# Patient Record
Sex: Male | Born: 1985 | Race: Black or African American | Hispanic: No | Marital: Married | Smoking: Never smoker
Health system: Southern US, Community
[De-identification: ages and names within clinical notes are randomized; demographics above are authoritative.]

---
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
## 2005-03-15 ENCOUNTER — Emergency Department (HOSPITAL_COMMUNITY): Admission: EM | Admit: 2005-03-15 | Discharge: 2005-03-15 | Payer: Self-pay | Admitting: Family Medicine

## 2014-10-06 DIAGNOSIS — Z79899 Other long term (current) drug therapy: Secondary | ICD-10-CM | POA: Insufficient documentation

## 2014-10-06 DIAGNOSIS — L700 Acne vulgaris: Secondary | ICD-10-CM | POA: Insufficient documentation

## 2016-10-14 DIAGNOSIS — L739 Follicular disorder, unspecified: Secondary | ICD-10-CM | POA: Insufficient documentation

## 2019-04-06 DIAGNOSIS — M7989 Other specified soft tissue disorders: Secondary | ICD-10-CM | POA: Diagnosis not present

## 2019-04-06 DIAGNOSIS — M79674 Pain in right toe(s): Secondary | ICD-10-CM | POA: Diagnosis not present

## 2019-04-06 DIAGNOSIS — Z0000 Encounter for general adult medical examination without abnormal findings: Secondary | ICD-10-CM | POA: Diagnosis not present

## 2019-04-06 DIAGNOSIS — N289 Disorder of kidney and ureter, unspecified: Secondary | ICD-10-CM | POA: Diagnosis not present

## 2019-04-06 DIAGNOSIS — Z130 Encounter for screening for diseases of the blood and blood-forming organs and certain disorders involving the immune mechanism: Secondary | ICD-10-CM | POA: Diagnosis not present

## 2019-04-06 DIAGNOSIS — I10 Essential (primary) hypertension: Secondary | ICD-10-CM | POA: Insufficient documentation

## 2019-12-28 DIAGNOSIS — I10 Essential (primary) hypertension: Secondary | ICD-10-CM | POA: Diagnosis not present

## 2020-02-03 ENCOUNTER — Ambulatory Visit: Payer: Self-pay | Admitting: Cardiovascular Disease

## 2020-02-15 DIAGNOSIS — I10 Essential (primary) hypertension: Secondary | ICD-10-CM | POA: Diagnosis not present

## 2020-02-16 DIAGNOSIS — Z23 Encounter for immunization: Secondary | ICD-10-CM | POA: Diagnosis not present

## 2020-03-17 ENCOUNTER — Other Ambulatory Visit: Payer: Self-pay

## 2020-03-17 ENCOUNTER — Ambulatory Visit (INDEPENDENT_AMBULATORY_CARE_PROVIDER_SITE_OTHER): Payer: BC Managed Care – PPO | Admitting: Cardiovascular Disease

## 2020-03-17 ENCOUNTER — Encounter: Payer: Self-pay | Admitting: Cardiovascular Disease

## 2020-03-17 DIAGNOSIS — I10 Essential (primary) hypertension: Secondary | ICD-10-CM | POA: Diagnosis not present

## 2020-03-17 DIAGNOSIS — E7800 Pure hypercholesterolemia, unspecified: Secondary | ICD-10-CM

## 2020-03-17 HISTORY — DX: Pure hypercholesterolemia, unspecified: E78.00

## 2020-03-17 HISTORY — DX: Essential (primary) hypertension: I10

## 2020-03-17 MED ORDER — CHLORTHALIDONE 25 MG PO TABS: 25.0000 mg | ORAL_TABLET | Freq: Every day | ORAL | 3 refills | Status: DC

## 2020-03-17 NOTE — Progress Notes (Signed)
Hypertension Clinic Initial Assessment:    Date:  03/17/2020   ID:  Victor Black, DOB [birth year], MRN [id number]  PCP:  Tomasa Hose, NP  Cardiologist:  No primary care provider on file.  Nephrologist:  Referring MD: No ref. provider found   CC: Hypertension  History of Present Illness:    Victor Black is a 35 y.o. male with a hx of hypertension and hyperlipidemia here to establish care in the hypertension clinic.  He was first diagnosed with hypertension 6 years ago.  Since then he is struggled with intolerances to different medications.  He was initially started on lisinopril but developed pruritus.  This was discontinued and he started on amlodipine.  This controlled his blood pressure well, but he developed lower extremity edema.  He was then switched to diltiazem which did not control his blood pressure.  Most recently he was started on losartan.  He feels poorly on this medication and does not feel like himself.  Overall he is very active.  He cycles 4 days a week and has no exertional chest pain or shortness of breath.  He denies any lower extremity edema, orthopnea, or PND.  He does not snore.  He has limited caffeine and salt intake.  He rarely drinks alcohol.  His meals are prepared at home and are not salty.  He has not eaten any beef or pork in years.  He tried stopping his medication and he felt much better, but his blood pressure was in the 150s.  On the medicine most recently it has been in the 140s.  He is very concerned about his health because his father died of a stroke at age 55.  He has several family members on his father's side who also struggle with poorly controlled hypertension and strokes.  His cholesterol has been high but not enough to require medications.  Previous antihypertensives: Lisinopril- itching Amlodipine-edema Diltiazem-didn't work Losartan-chest pain, dizzy   Past Medical History:  Diagnosis Date  . Essential hypertension 03/17/2020  . Pure  hypercholesterolemia 03/17/2020    History reviewed. No pertinent surgical history.  Current Medications: Current Meds  Medication Sig  . chlorthalidone (HYGROTON) 25 MG tablet Take 1 tablet (25 mg total) by mouth daily.  . [DISCONTINUED] lisinopril (ZESTRIL) 20 MG tablet TAKE 1 TABLET(20 MG) BY MOUTH DAILY  . [DISCONTINUED] losartan (COZAAR) 25 MG tablet Take 28m (1 TABLET) for 5 days and then increase to 565m(2 TABLETS) if blood pressure remains greater than 130/80     Allergies:   Lisinopril   Social History   Socioeconomic History  . Marital status: Single    Spouse name: Not on file  . Number of children: Not on file  . Years of education: Not on file  . Highest education level: Not on file  Occupational History  . Not on file  Tobacco Use  . Smoking status: Never Smoker  . Smokeless tobacco: Never Used  Substance and Sexual Activity  . Alcohol use: Not on file  . Drug use: Not on file  . Sexual activity: Not on file  Other Topics Concern  . Not on file  Social History Narrative  . Not on file   Social Determinants of Health   Financial Resource Strain: Not on file  Food Insecurity: Not on file  Transportation Needs: Not on file  Physical Activity: Not on file  Stress: Not on file  Social Connections: Not on file     Family History: The patient's family  history includes Hypertension in his father and maternal uncle; Stroke in his maternal uncle; Stroke (age of onset: 70) in his father; Thyroid disease in his mother.  ROS:   Please see the history of present illness.    All other systems reviewed and are negative.  EKGs/Labs/Other Studies Reviewed:    EKG:  EKG is ordered today.  The ekg ordered today demonstrates sinus rhythm.  Rate 62 bpm.  Recent Labs: No results found for requested labs within last 8760 hours.   Recent Lipid Panel No results found for: CHOL, TRIG, HDL, CHOLHDL, VLDL, LDLCALC, LDLDIRECT   04/06/2019: Sodium 144, potassium 4.1,  BUN 16, creatinine 1.3, AST 14, ALT 11 EGFR 72 Total cholesterol 206, triglyceride 222, HDL 41, LDL 126  10/2018: Total cholesterol 204, triglycerides 165, HDL 39, LDL 135   Physical Exam:    VS:  BP 132/82   Pulse 62   Ht 6' (1.829 m)   Wt 213 lb (96.6 kg)   SpO2 97%   BMI 28.89 kg/m  , BMI Body mass index is 28.89 kg/m. GENERAL:  Well appearing HEENT: Pupils equal round and reactive, fundi not visualized, oral mucosa unremarkable NECK:  No jugular venous distention, waveform within normal limits, carotid upstroke brisk and symmetric, no bruits LUNGS:  Clear to auscultation bilaterally HEART:  RRR.  PMI not displaced or sustained,S1 and S2 within normal limits, no S3, no S4, no clicks, no rubs, no murmurs ABD:  Flat, positive bowel sounds normal in frequency in pitch, no bruits, no rebound, no guarding, no midline pulsatile mass, no hepatomegaly, no splenomegaly EXT:  2 plus pulses throughout, no edema, no cyanosis no clubbing SKIN:  No rashes no nodules NEURO:  Cranial nerves II through XII grossly intact, motor grossly intact throughout PSYCH:  Cognitively intact, oriented to person place and time   ASSESSMENT:    1. Essential hypertension   2. Pure hypercholesterolemia     PLAN:    # Hypertension:  Mr. Gundry's blood pressure has been high since his 44s.  This is despite having a very healthy lifestyle where he exercises regularly and has a healthy diet limited in sodium intake.  Therefore, he warrants a work-up for secondary causes of hypertension.  We will check renal artery Dopplers.  We will also check renal and and aldosterone levels and a TSH.  He is not tolerating losartan.  We will stop this medication.  Switch to chlorthalidone 25 mg daily.  Check a CMP in 1 week.  Secondary Causes of Hypertension  Medications/Herbal: OCP, steroids, stimulants, antidepressants, weight loss medication, immune suppressants, NSAIDs, sympathomimetics, alcohol, caffeine, licorice,  ginseng, St. John's wort, chemo (none identified)  Sleep Apnea: Testing not indicated Renal artery stenosis: Check renal artery Dopplers Hyperaldosteronism: Check renin and aldosterone Hyper/hypothyroidism: Check TSH Pheochromocytoma: Testing indicated Cushing's syndrome: Testing not indicated Coarctation of the aorta: Blood pressure symmetric  # Hyperlipidemia:  Lipids are elevated despite healthy lifestyle as above.  Given his family history of premature stroke, I am concerned that his lipid goal may not be accurately captured by the ASCVD 10-year risk score.  We will get a coronary calcium score.  Assess the need for further intervention based on those findings.  For now, continue diet and exercise.   Disposition:    FU with MD/PharmD in 1 month    Medication Adjustments/Labs and Tests Ordered: Current medicines are reviewed at length with the patient today.  Concerns regarding medicines are outlined above.  Orders Placed This Encounter  Procedures  . CT CARDIAC SCORING (SELF PAY ONLY)  . TSH  . Basic metabolic panel  . Aldosterone + renin activity w/ ratio  . EKG 12-Lead  . VAS US RENAL ARTERY DUPLEX   Meds ordered this encounter  Medications  . chlorthalidone (HYGROTON) 25 MG tablet    Sig: Take 1 tablet (25 mg total) by mouth daily.    Dispense:  90 tablet    Refill:  3     Signed, Skeet Latch, MD  03/17/2020 5:38 PM    [place]

## 2020-03-17 NOTE — Patient Instructions (Addendum)
Medication Instructions:  STOP LOSARTAN   START CHLORTHALIDONE 25 MG DAILY    Labwork: BMET/RENIN/ALDOSTERONE/TSH 1 WEEK, FIRST THING IN THE MORNING    Testing/Procedures: Your physician has requested that you have a renal artery duplex. During this test, an ultrasound is used to evaluate blood flow to the kidneys. Allow one hour for this exam. Do not eat after midnight the day before and avoid carbonated beverages. Take your medications as you usually do.  CALCIUM SCORE THIS WILL BE $99 OUT OF POCKET [facility] AT [address]   Follow-Up: 1 MONTH PHARM D 04/15/2020 AT 9:30 AM    Special Instructions:   MONITOR YOUR BLOOD PRESSURE TWICE A DAY, LOG IN THE BOOK PROVIDED. BRING THE BOOK AND YOUR BLOOD PRESSURE MACHINE TO YOUR FOLLOW UP IN 1 MONTH

## 2020-03-25 DIAGNOSIS — Z3009 Encounter for other general counseling and advice on contraception: Secondary | ICD-10-CM | POA: Diagnosis not present

## 2020-04-04 DIAGNOSIS — Z302 Encounter for sterilization: Secondary | ICD-10-CM | POA: Diagnosis not present

## 2020-04-08 DIAGNOSIS — Z0000 Encounter for general adult medical examination without abnormal findings: Secondary | ICD-10-CM | POA: Diagnosis not present

## 2020-04-08 DIAGNOSIS — I10 Essential (primary) hypertension: Secondary | ICD-10-CM | POA: Diagnosis not present

## 2020-04-08 DIAGNOSIS — Z6828 Body mass index (BMI) 28.0-28.9, adult: Secondary | ICD-10-CM | POA: Diagnosis not present

## 2020-04-08 DIAGNOSIS — Z130 Encounter for screening for diseases of the blood and blood-forming organs and certain disorders involving the immune mechanism: Secondary | ICD-10-CM | POA: Diagnosis not present

## 2020-04-11 ENCOUNTER — Other Ambulatory Visit: Payer: Self-pay

## 2020-04-11 ENCOUNTER — Ambulatory Visit (INDEPENDENT_AMBULATORY_CARE_PROVIDER_SITE_OTHER)
Admission: RE | Admit: 2020-04-11 | Discharge: 2020-04-11 | Disposition: A | Discharge status: Home / Self Care | Payer: Self-pay | Source: Ambulatory Visit | Attending: Cardiovascular Disease | Admitting: Cardiovascular Disease

## 2020-04-11 DIAGNOSIS — I10 Essential (primary) hypertension: Secondary | ICD-10-CM

## 2020-04-11 DIAGNOSIS — E7800 Pure hypercholesterolemia, unspecified: Secondary | ICD-10-CM

## 2020-04-11 DIAGNOSIS — E782 Mixed hyperlipidemia: Secondary | ICD-10-CM | POA: Insufficient documentation

## 2020-04-14 ENCOUNTER — Telehealth: Payer: Self-pay | Admitting: *Deleted

## 2020-04-14 NOTE — Telephone Encounter (Signed)
Advised patient and sent to PCP  

## 2020-04-14 NOTE — Telephone Encounter (Signed)
-----   Message from Chilton Si, MD sent at 04/12/2020  3:49 PM EDT ----- There was no evidence of coronary artery disease on his CT scan.  Great news.  Based on that I think we should keep focusing on diet and exercise to control cholesterol rather than medication for now.

## 2020-04-15 ENCOUNTER — Other Ambulatory Visit: Payer: Self-pay

## 2020-04-15 ENCOUNTER — Encounter: Payer: Self-pay | Admitting: Pharmacist

## 2020-04-15 ENCOUNTER — Ambulatory Visit (INDEPENDENT_AMBULATORY_CARE_PROVIDER_SITE_OTHER): Payer: BC Managed Care – PPO | Admitting: Pharmacist

## 2020-04-15 ENCOUNTER — Ambulatory Visit (HOSPITAL_COMMUNITY)
Admission: RE | Admit: 2020-04-15 | Discharge: 2020-04-15 | Disposition: A | Discharge status: Home / Self Care | Payer: BC Managed Care – PPO | Source: Ambulatory Visit | Attending: Cardiology | Admitting: Cardiology

## 2020-04-15 VITALS — BP 128/82 | HR 68

## 2020-04-15 DIAGNOSIS — I10 Essential (primary) hypertension: Secondary | ICD-10-CM

## 2020-04-15 DIAGNOSIS — E7800 Pure hypercholesterolemia, unspecified: Secondary | ICD-10-CM

## 2020-04-15 NOTE — Patient Instructions (Addendum)
It was nice meeting you today.  We would like your blood pressure to be less than 130/80 and it was very close today  Continue your chlorthalidone 25 mg once a day in the morning  I would like to recheck your kidney function in about a week  Please continue to monitor your blood pressure at home and you can send in your readings  We will see you back in clinic in a month  Laural Golden, PharmD, BCACP, CDCES, CPP [facility] [address], [place], [place] [address] Phone: [phone]; Fax: [phone] 04/15/2020 9:29 AM

## 2020-04-15 NOTE — Progress Notes (Signed)
Patient ID: Victor Black                 DOB: [birth year]                      MRN: [id number]     HPI: Victor Black is a 35 y.o. male referred by Dr. Duke Salvia to HTN clinic. PMH is significant for HTN and HLD. He has a strong family history of early cardiac disease.  Seen by Dr. Duke Salvia on 03/17/20.    Patient presents today for first visit to Adv HTN clinic.  Patient has struggled with HTN in the past and his father died from a stroke at 75.  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  Brother is a Development worker, community in [place].    Overall he is extremely active and physically fit.  Bikes for 20-30 miles a day during the week and 60-70 miles per day on weekends.  Eats a low salt diet and has not had beef or pork for 7 years.  Likely had an allergic reaction to lisinopril but it was delayed.  Reports his blood pressure had been controlled while taking it until he started itching and it was d/c.  Took 10mg  of amlodipine in 2019 and tolerated it until Summer when he noticed his legs started swelling.  It appears he was started on 5mg  which was then titrated to 10mg .  Diltiazem and losartan made him feel weak.   Reports he has been tolerating chlorthalidone 25mg  with no adverse effects.  PCP ran lab work last week which showed slightly above normal Scr.  He thinks this may be his baseline since he is so physically active but unsure.  Takes his BP at home using a wrist cuff.  Reports his home readings are typically higher than at medical offices so is considering purchasing a new BP cuff.  Current HTN meds: chlorthalidone 25 mg daily Previously tried: lisinopril (itching), amlodipine (LEE), losartan (dizzy, chest pain), diltiazem (ineffective) BP goal: <130/80  Family History: Stroke - father at 39.  Father's family has history of HTN,   Wt Readings from Last 3 Encounters:  03/17/20 213 lb (96.6 kg)   BP Readings from Last 3 Encounters:  03/17/20 132/82   Pulse Readings from Last 3  Encounters:  03/17/20 62    Renal function: CrCl cannot be calculated (No successful lab value found.).  Past Medical History:  Diagnosis Date  . Essential hypertension 03/17/2020  . Pure hypercholesterolemia 03/17/2020    Current Outpatient Medications on File Prior to Visit  Medication Sig Dispense Refill  . chlorthalidone (HYGROTON) 25 MG tablet Take 1 tablet (25 mg total) by mouth daily. 90 tablet 3   No current facility-administered medications on file prior to visit.    Allergies  Allergen Reactions  . Lisinopril Itching     Assessment/Plan:    1. Hypertension - Patient BP in room today 128/82 which is slightly above goal of <130/80 but is improved from previous readings.  Recommended patient purchase upper arm cuff for more accurate home readings.  Patient purchased in room from 03/19/20.  Scr slightly above upper limit of normal from PCP lab work. Unclear if this is due to patient's exercise routine or adverse effect of chlorthalidone.  Recommend rechecking BMP in 2 weeks to monitor Scr to assess if renal function declining on chlorthalidone or if this is patient's baseline.  Patient is agreeable.  Also having renal study today.  Not a candidate for ACEi or ARB due to allergy and intolerance.  Possibly a candidate for trial with low dose CCB since patient noticed swelling while on high dose amlodipine. Since patient very physically active, do not want to use medications which may slow his heart rate.   Plan: Continue chlorthalidone 25mg  once daily Recheck BMP in 2 weeks Patient to self monitor BP at home using new BP cuff and send results via mychart If Scr stable, continue chlorthalidone.  If renal function declining, consider alternatives or reduced dose Recheck in clinic for HTN visit #2 in 4 weeks  , PharmD, BCACP, CDCES, CPP [facility] [address], [place], [place] Phone: [phone]; Fax: [phone] 04/15/2020  10:18 AM

## 2020-04-26 ENCOUNTER — Telehealth: Payer: Self-pay | Admitting: Cardiovascular Disease

## 2020-04-26 DIAGNOSIS — I701 Atherosclerosis of renal artery: Secondary | ICD-10-CM

## 2020-04-26 DIAGNOSIS — I10 Essential (primary) hypertension: Secondary | ICD-10-CM

## 2020-04-26 NOTE — Telephone Encounter (Signed)
Advised patient, order for CTA placed, and message to schedulers to arrange

## 2020-04-26 NOTE — Telephone Encounter (Signed)
Patient would like someone to go over his results from his test 04/15/20. He is leaving to travel internationally tomorrow (04/27/20) and would like someone to call him today

## 2020-04-26 NOTE — Telephone Encounter (Signed)
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  This may be artifact, but someone his age should not really have any blockage.  Recommend getting a CT-A of the abdomen to better assess.  This will give [place] a better answer as to whether or not this is truly a problem.

## 2020-04-27 ENCOUNTER — Telehealth: Payer: Self-pay | Admitting: Cardiovascular Disease

## 2020-04-27 NOTE — Telephone Encounter (Signed)
Spoke with patient regarding the Friday 05/13/20 2:30 pm CTA abdomen/pelvis appointment scheduled at [facility]---arrival time is 2:15 pm--[address], [address]---liquids only 4 hours prior to testing.  Will mail information to patient and he voiced his understanding.

## 2020-05-11 DIAGNOSIS — I10 Essential (primary) hypertension: Secondary | ICD-10-CM | POA: Diagnosis not present

## 2020-05-11 DIAGNOSIS — E7800 Pure hypercholesterolemia, unspecified: Secondary | ICD-10-CM | POA: Diagnosis not present

## 2020-05-12 LAB — BASIC METABOLIC PANEL
Glucose: 88 mg/dL (ref 65–99)
Potassium: 3.8 mmol/L (ref 3.5–5.2)

## 2020-05-12 LAB — TSH: TSH: 2.02 u[IU]/mL (ref 0.450–4.500)

## 2020-05-12 LAB — ALDOSTERONE + RENIN ACTIVITY W/ RATIO

## 2020-05-13 ENCOUNTER — Ambulatory Visit (INDEPENDENT_AMBULATORY_CARE_PROVIDER_SITE_OTHER)
Admission: RE | Admit: 2020-05-13 | Discharge: 2020-05-13 | Disposition: A | Discharge status: Home / Self Care | Payer: BC Managed Care – PPO | Source: Ambulatory Visit | Attending: Cardiovascular Disease | Admitting: Cardiovascular Disease

## 2020-05-13 ENCOUNTER — Other Ambulatory Visit: Payer: Self-pay

## 2020-05-13 ENCOUNTER — Ambulatory Visit (INDEPENDENT_AMBULATORY_CARE_PROVIDER_SITE_OTHER): Payer: BC Managed Care – PPO | Admitting: Pharmacist Clinician (PhC)/ Clinical Pharmacy Specialist

## 2020-05-13 ENCOUNTER — Inpatient Hospital Stay: Admission: RE | Admit: 2020-05-13 | Payer: BC Managed Care – PPO | Source: Ambulatory Visit

## 2020-05-13 VITALS — BP 122/88 | HR 61 | Resp 16 | Ht 72.0 in | Wt 210.6 lb

## 2020-05-13 DIAGNOSIS — I701 Atherosclerosis of renal artery: Secondary | ICD-10-CM | POA: Diagnosis not present

## 2020-05-13 DIAGNOSIS — I10 Essential (primary) hypertension: Secondary | ICD-10-CM | POA: Diagnosis not present

## 2020-05-13 MED ORDER — CHLORTHALIDONE 25 MG PO TABS: 12.5000 mg | ORAL_TABLET | Freq: Every day | ORAL | 3 refills | Status: DC

## 2020-05-13 MED ORDER — IOHEXOL 350 MG/ML SOLN
100.0000 mL | Freq: Once | INTRAVENOUS | Status: AC | PRN
  Administered 2020-05-13: 100 mL via INTRAVENOUS

## 2020-05-13 MED ORDER — AMLODIPINE BESYLATE 2.5 MG PO TABS: 2.5000 mg | ORAL_TABLET | Freq: Every day | ORAL | 3 refills | Status: DC

## 2020-05-13 NOTE — Progress Notes (Signed)
05/13/2020 Victor Black [birth year] [id number]   HPI:  Victor Black is a 35 y.o. male patient of Dr Duke Salvia, with a PMH below who presents today for advanced hypertension clinic follow up.  He was originally seen by Dr. Duke Salvia in February, who noted he had been first diagnosed with hypertension about 6 years ago.  In the interim he has struggled with ability to tolerate medications.  See intolerances listed below.  Dr. Duke Salvia started him on chlorthalidone 25 mg daily.  He was first seen in follow up last month at which time his pressure was much improved at 128/82.  He was tolerating the chlorthalidone well, although there was a slight bump in his SCr.  He was then seen by Laural Golden in the CVRR clinic.  At that time his pressure was much improved at 128/82.  No changes were made and he was scheduled for a repeat BMET check in another 2 weeks.  That lab showed further decline in SCr, from the baseline of 1.30 to 1.43.   He returns today for follow up.  His pressure is up a little today, but he has just come from his renal CT scan and is a bit nervous.  He is at the age his father passed away and he's trying to make sure he does everything right to stay healthy.  In his most recent lab Dr. Duke Salvia noted that we should probably cut back on chlorthalidone to protect kidney function.  He states he has done well with this med, no side effects or problems, and no issues with compliance.  We reviewed the results of his study this morning as well as previous secondary cause studies, to be sure he understood why each was done and what the results showed.      Blood Pressure Goal:  130/80  Current Medications: chlorthladione  Family Hx: father died from stroke at age 34; mother with hyperlipidemia  Social Hx: no tobacco,  rare alcohol and no caffeine  Diet: limited sodium, food prepared by chef/meal prep - little to no sodium; vegetables mostly fresh, no beef or pork, mostly chicken and [place],  has rare bag of potato chips, but mostly avoids snacking  Exercise: cycles 4 days per week 20-30 miles on weekdays, longer on weekends  Home BP readings:  Highest was 124 in past 3 weeks, last week all < 120, consistently 115-120, diastolic consistntly in the 80's.    Intolerances:   Lisinopril- itching  Amlodipine-edema  Diltiazem-didn't work  Losartan-chest pain, dizzy  Labs: 05/11/20:  Na 139, K 3.8, Glu 88, BUN 15, SCr 1.43  04/09/20:  Na 144, K 4.1, Glu 88, BUN 13, SCr 1.3   Wt Readings from Last 3 Encounters:  05/13/20 210 lb 9.6 oz (95.5 kg)  03/17/20 213 lb (96.6 kg)   BP Readings from Last 3 Encounters:  05/13/20 122/88  04/15/20 128/82  03/17/20 132/82   Pulse Readings from Last 3 Encounters:  05/13/20 61  04/15/20 68  03/17/20 62    Current Outpatient Medications  Medication Sig Dispense Refill  . amLODipine (NORVASC) 2.5 MG tablet Take 1 tablet (2.5 mg total) by mouth daily. 30 tablet 3  . chlorthalidone (HYGROTON) 25 MG tablet Take 0.5 tablets (12.5 mg total) by mouth daily. 45 tablet 3   No current facility-administered medications for this visit.    Allergies  Allergen Reactions  . Lisinopril Itching    Past Medical History:  Diagnosis Date  . Essential hypertension 03/17/2020  .  Pure hypercholesterolemia 03/17/2020    Blood pressure 122/88, pulse 61, resp. rate 16, height 6' (1.829 m), weight 210 lb 9.6 oz (95.5 kg), SpO2 97 %.  Essential hypertension Patient with hypertension, still mostly diastolic.  Most secondary causes have been eliminated, still waiting for results of hypoaldosterone panel.  Because of the bump in SCr (although not >30%) will cut chlorthalidone back to 12.5 mg daily.  Will also re-challenge with lower dose of amlodipine.  He will start 2.5 mg once daily, he can take both medications at the same time.  He will continue to watch home BP readings and return in one month for follow up.     Phillips Hay PharmD CPP [facility] [address] [place], [place] [address] [phone]

## 2020-05-13 NOTE — Assessment & Plan Note (Signed)
Patient with hypertension, still mostly diastolic.  Most secondary causes have been eliminated, still waiting for results of hypoaldosterone panel.  Because of the bump in SCr (although not >30%) will cut chlorthalidone back to 12.5 mg daily.  Will also re-challenge with lower dose of amlodipine.  He will start 2.5 mg once daily, he can take both medications at the same time.  He will continue to watch home BP readings and return in one month for follow up.

## 2020-05-13 NOTE — Patient Instructions (Addendum)
Return for a a follow up appointment May 17 at 2 pm  Go to the lab May 2 for kidney function tests  Take your BP meds as follows:   Cut chlorthalidone in half and take only 12.5 mg once daily   Start amlodipine 2.5 mg once daily.  You can take both of these at the same time  Bring all of your meds, your BP cuff and your record of home blood pressures to your next appointment.  Exercise as you're able, try to walk approximately 30 minutes per day.  Keep salt intake to a minimum, especially watch canned and prepared boxed foods.  Eat more fresh fruits and vegetables and fewer canned items.  Avoid eating in fast food restaurants.    HOW TO TAKE YOUR BLOOD PRESSURE: . Rest 5 minutes before taking your blood pressure. .  Don't smoke or drink caffeinated beverages for at least 30 minutes before. . Take your blood pressure before (not after) you eat. . Sit comfortably with your back supported and both feet on the floor (don't cross your legs). . Elevate your arm to heart level on a table or a desk. . Use the proper sized cuff. It should fit smoothly and snugly around your bare upper arm. There should be enough room to slip a fingertip under the cuff. The bottom edge of the cuff should be 1 inch above the crease of the elbow. . Ideally, take 3 measurements at one sitting and record the average.

## 2020-05-23 LAB — BASIC METABOLIC PANEL
BUN/Creatinine Ratio: 10 (ref 9–20)
BUN: 15 mg/dL (ref 6–20)
CO2: 25 mmol/L (ref 20–29)
Calcium: 10.2 mg/dL (ref 8.7–10.2)
Chloride: 96 mmol/L (ref 96–106)
Creatinine, Ser: 1.43 mg/dL — ABNORMAL HIGH (ref 0.76–1.27)
Sodium: 139 mmol/L (ref 134–144)
eGFR: 66 mL/min/{1.73_m2} (ref >59)

## 2020-05-23 LAB — ALDOSTERONE + RENIN ACTIVITY W/ RATIO: ALDOSTERONE: 11.6 ng/dL (ref 0.0–30.0)

## 2020-06-06 DIAGNOSIS — E782 Mixed hyperlipidemia: Secondary | ICD-10-CM | POA: Diagnosis not present

## 2020-06-14 ENCOUNTER — Other Ambulatory Visit: Payer: Self-pay

## 2020-06-14 ENCOUNTER — Ambulatory Visit (INDEPENDENT_AMBULATORY_CARE_PROVIDER_SITE_OTHER): Payer: BC Managed Care – PPO | Admitting: Pharmacist Clinician (PhC)/ Clinical Pharmacy Specialist

## 2020-06-14 ENCOUNTER — Encounter: Payer: Self-pay | Admitting: Pharmacist Clinician (PhC)/ Clinical Pharmacy Specialist

## 2020-06-14 DIAGNOSIS — I10 Essential (primary) hypertension: Secondary | ICD-10-CM | POA: Diagnosis not present

## 2020-06-14 MED ORDER — CARVEDILOL 3.125 MG PO TABS: 3.1250 mg | ORAL_TABLET | Freq: Two times a day (BID) | ORAL | 3 refills | Status: DC

## 2020-06-14 NOTE — Patient Instructions (Signed)
Return for a a follow up appointment June 20 at 10:30 am  Check your blood pressure at home daily and keep record of the readings.  Take your BP meds as follows:  Stop chlorthalidone  Start carvedilol 3.125 mg twice daily.  Call Belenda Cruise if you have any issues - [phone]  Bring all of your meds, your BP cuff and your record of home blood pressures to your next appointment.  Exercise as you're able, try to walk approximately 30 minutes per day.  Keep salt intake to a minimum, especially watch canned and prepared boxed foods.  Eat more fresh fruits and vegetables and fewer canned items.  Avoid eating in fast food restaurants.    HOW TO TAKE YOUR BLOOD PRESSURE: . Rest 5 minutes before taking your blood pressure. .  Don't smoke or drink caffeinated beverages for at least 30 minutes before. . Take your blood pressure before (not after) you eat. . Sit comfortably with your back supported and both feet on the floor (don't cross your legs). . Elevate your arm to heart level on a table or a desk. . Use the proper sized cuff. It should fit smoothly and snugly around your bare upper arm. There should be enough room to slip a fingertip under the cuff. The bottom edge of the cuff should be 1 inch above the crease of the elbow. . Ideally, take 3 measurements at one sitting and record the average.

## 2020-06-14 NOTE — Progress Notes (Signed)
06/14/2020 Victor Black 20-Oct-1985 [id number]   HPI:  Victor Black is a 35 y.o. male patient of Dr Duke Salvia, with a PMH below who presents today for advanced hypertension clinic follow up.  He was originally seen by Dr. Duke Salvia in February, who noted he had been first diagnosed with hypertension about 6 years ago.  In the interim he has struggled with ability to tolerate medications.  See intolerances listed below.  Dr. Duke Salvia started him on chlorthalidone 25 mg daily.  He was first seen in follow up last month at which time his pressure was much improved at 128/82.  He was tolerating the chlorthalidone well, although there was a slight bump in his SCr.  He was then seen by Laural Golden in the CVRR clinic.  At that time his pressure was much improved at 128/82.  No changes were made and he was scheduled for a repeat BMET check in another 2 weeks.  That lab showed further decline in SCr, from the baseline of 1.30 to 1.43.  Chlorthalidone was cut back to 12.5 mg daily and amlodipine 2.5 mg was added.  Unfortunately a follow up metabolic panel last week showed further increase in SCr to 1.56.    Today he returns for follow up.  He does note some edema in his ankles and feet, however it is not severe and he is willing to continue with this.  We will need to discontinue chlorthalidone at this time.      Blood Pressure Goal:  130/80  Current Medications: amlodipine 2.5 mg qd  Family Hx: father died from stroke at age 32; mother with hyperlipidemia  Social Hx: no tobacco,  rare alcohol and no caffeine  Diet: limited sodium, food prepared by chef/meal prep - little to no sodium; vegetables mostly fresh, no beef or pork, mostly chicken and [place], has rare bag of potato chips, but mostly avoids snacking  Exercise: cycles 4 days per week 20-30 miles on weekdays, longer on weekends  Home BP readings:  Has 10-12 readings on his phone from his [employer] cuff.  Systolic readings ranged 110-134 and  diastolic 77-87, with most of those readings in the 81-84 range.    Intolerances:   Lisinopril- itching  Amlodipine-edema  Diltiazem-didn't work  Losartan-chest pain, dizzy  Labs:  06/06/20:  Na 145, K 4, Glu 97, BUN 14, SCr 1.56  05/11/20:  Na 139, K 3.8, Glu 88, BUN 15, SCr 1.43  04/09/20:  Na 144, K 4.1, Glu 88, BUN 13, SCr 1.3   Wt Readings from Last 3 Encounters:  05/13/20 210 lb 9.6 oz (95.5 kg)  03/17/20 213 lb (96.6 kg)   BP Readings from Last 3 Encounters:  06/14/20 138/86  05/13/20 122/88  04/15/20 128/82   Pulse Readings from Last 3 Encounters:  06/14/20 64  05/13/20 61  04/15/20 68    Current Outpatient Medications  Medication Sig Dispense Refill  . carvedilol (COREG) 3.125 MG tablet Take 1 tablet (3.125 mg total) by mouth 2 (two) times daily. 60 tablet 3  . amLODipine (NORVASC) 2.5 MG tablet Take 1 tablet (2.5 mg total) by mouth daily. 30 tablet 3   No current facility-administered medications for this visit.    Allergies  Allergen Reactions  . Lisinopril Itching    Past Medical History:  Diagnosis Date  . Essential hypertension 03/17/2020  . Pure hypercholesterolemia 03/17/2020    Blood pressure 138/86, pulse 64.  Essential hypertension Patient with essential hypertension, still not to goal.  Unable to continue with ACE/ARB/diuretics due to worsening renal function. Discontinued chlorthalidone today and will have patient repeat labs in 2 weeks.  Encouraged him to stay well hydrated with exercise, now that weather is warming up.  Reviewed options of carvedilol or hydralazine for further BP lowering benefit.  Discussed side effects, dosing and mechanism of action.  Will have him start with carvedilol 3.125 mg twice daily.  He will continue with amlodipine 2.5 mg.  Explained that because of his sensitivities to medications, he may need 2-3 medications at low doses rather than 1 or 2 at more therapeutic doses.  Patient understands and is agreeable with this.     Phillips Hay PharmD CPP [facility] [address] [place], [place] [address] [phone]

## 2020-06-14 NOTE — Progress Notes (Deleted)
     HPI:  Victor Black is a 35 y.o. male patient of Dr Duke Salvia, with a PMH below who presents today for advanced hypertension clinic follow up.  He was originally seen by Dr. Duke Salvia in February, who noted he had been first diagnosed with hypertension about 6 years ago.  In the interim he has struggled with ability to tolerate medications.  See intolerances listed below.      Blood Pressure Goal:  < 130/80  Current Medications:  Amlodipine 2.5mg  daily chlorthladione 12.5mg  daily  Family Hx: father died from stroke at age 86; mother with hyperlipidemia  Social Hx: no tobacco,  rare alcohol and no caffeine  Diet: limited sodium, food prepared by chef/meal prep - little to no sodium; vegetables mostly fresh, no beef or pork, mostly chicken and [place], has rare bag of potato chips, but mostly avoids snacking  Exercise: cycles 4 days per week 20-30 miles on weekdays, longer on weekends  Home BP readings:  Highest was 124 in past 3 weeks, last week all < 120, consistently 115-120, diastolic consistntly in the 80's.    Intolerances:   Lisinopril- itching  Amlodipine-edema  Diltiazem-didn't work  Losartan-chest pain, dizzy  Labs: 05/11/20:  Na 139, K 3.8, Glu 88, BUN 15, SCr 1.43  04/09/20:  Na 144, K 4.1, Glu 88, BUN 13, SCr 1.3   Wt Readings from Last 3 Encounters:  05/13/20 210 lb 9.6 oz (95.5 kg)  03/17/20 213 lb (96.6 kg)   BP Readings from Last 3 Encounters:  05/13/20 122/88  04/15/20 128/82  03/17/20 132/82   Pulse Readings from Last 3 Encounters:  05/13/20 61  04/15/20 68  03/17/20 62    Current Outpatient Medications  Medication Sig Dispense Refill  . amLODipine (NORVASC) 2.5 MG tablet Take 1 tablet (2.5 mg total) by mouth daily. 30 tablet 3  . chlorthalidone (HYGROTON) 25 MG tablet Take 0.5 tablets (12.5 mg total) by mouth daily. 45 tablet 3   No current facility-administered medications for this visit.    Allergies  Allergen Reactions  . Lisinopril Itching     Past Medical History:  Diagnosis Date  . Essential hypertension 03/17/2020  . Pure hypercholesterolemia 03/17/2020    There were no vitals taken for this visit.  No problem-specific Assessment & Plan notes found for this encounter.   Zya Finkle Rodriguez-Guzman PharmD, BCPS, CPP [facility] [address] [place] [address] 06/14/2020 11:44 AM

## 2020-06-14 NOTE — Assessment & Plan Note (Signed)
Patient with essential hypertension, still not to goal.  Unable to continue with ACE/ARB/diuretics due to worsening renal function. Discontinued chlorthalidone today and will have patient repeat labs in 2 weeks.  Encouraged him to stay well hydrated with exercise, now that weather is warming up.  Reviewed options of carvedilol or hydralazine for further BP lowering benefit.  Discussed side effects, dosing and mechanism of action.  Will have him start with carvedilol 3.125 mg twice daily.  He will continue with amlodipine 2.5 mg.  Explained that because of his sensitivities to medications, he may need 2-3 medications at low doses rather than 1 or 2 at more therapeutic doses.  Patient understands and is agreeable with this.

## 2020-07-18 ENCOUNTER — Other Ambulatory Visit: Payer: Self-pay

## 2020-07-18 ENCOUNTER — Ambulatory Visit (INDEPENDENT_AMBULATORY_CARE_PROVIDER_SITE_OTHER): Payer: BC Managed Care – PPO | Admitting: Pharmacist Clinician (PhC)/ Clinical Pharmacy Specialist

## 2020-07-18 DIAGNOSIS — I10 Essential (primary) hypertension: Secondary | ICD-10-CM

## 2020-07-18 MED ORDER — CHLORTHALIDONE 25 MG PO TABS: ORAL_TABLET | ORAL | 3 refills | Status: DC

## 2020-07-18 MED ORDER — AMLODIPINE BESYLATE 2.5 MG PO TABS: 2.5000 mg | ORAL_TABLET | Freq: Every day | ORAL | 3 refills | Status: DC

## 2020-07-18 MED ORDER — CARVEDILOL 3.125 MG PO TABS: 3.1250 mg | ORAL_TABLET | Freq: Two times a day (BID) | ORAL | 3 refills | Status: DC

## 2020-07-18 NOTE — Progress Notes (Signed)
07/18/2020 Clay Solum February 15, 1985 [id number]   HPI:  Victor Black is a 35 y.o. male patient of Dr Duke Salvia, with a PMH below who presents today for advanced hypertension clinic follow up.  He was originally seen by Dr. Duke Salvia in February, who noted he had been first diagnosed with hypertension about 6 years ago.  In the interim he has struggled with ability to tolerate medications.  See intolerances listed below.  Dr. Duke Salvia started him on chlorthalidone 25 mg daily.  He was first seen in follow up last month at which time his pressure was much improved at 128/82.  He was tolerating the chlorthalidone well, although there was a slight bump in his SCr.  He was then seen by Laural Golden in the CVRR clinic.  At that time his pressure was much improved at 128/82.  No changes were made and he was scheduled for a repeat BMET check in another 2 weeks.  That lab showed further decline in SCr, from the baseline of 1.30 to 1.43.  Chlorthalidone was cut back to 12.5 mg daily and amlodipine 2.5 mg was added.  Unfortunately a follow up metabolic panel last week showed further increase in SCr to 1.56.   At his last visit we discontinued chlorthalidone due to worsening renal function  (20% increase) and instead added carvedilol 3.125 mg bid.    Today he returns for follow up.  He admits has not exercised much in the past month as he came down with COVID.  Had body aches for a few days, but no lasting effects.  Did not check home BP readings as much, but did note that they have been noticeably higher since stopping the chlorthalidone.  He has not repeated metabolic panel since stopping that, but was planning to do so next week.    Blood Pressure Goal:  130/80  Current Medications: amlodipine 2.5 mg qd, carvedilol 3.125  Family Hx: father died from stroke at age 28; mother with hyperlipidemia  Social Hx: no tobacco,  rare alcohol and no caffeine  Diet: limited sodium, food prepared by chef/meal prep -  little to no sodium; vegetables mostly fresh, no beef or pork, mostly chicken and [place], has rare bag of potato chips, but mostly avoids snacking  Exercise: cycles 4 days per week 20-30 miles on weekdays, longer on weekends  Home BP readings:  has a few readings listed on his phone.  Systolic running mostly 130's, but diastolic has jumped to mostly between 98-102 per patient.    Intolerances:   Lisinopril- itching  Amlodipine-edema  Diltiazem-didn't work  Losartan-chest pain, dizzy  Chlorthalidone - increase in SCr  Labs:  06/06/20:  Na 145, K 4, Glu 97, BUN 14, SCr 1.56  05/11/20:  Na 139, K 3.8, Glu 88, BUN 15, SCr 1.43  04/09/20:  Na 144, K 4.1, Glu 88, BUN 13, SCr 1.3   Wt Readings from Last 3 Encounters:  07/18/20 216 lb (98 kg)  05/13/20 210 lb 9.6 oz (95.5 kg)  03/17/20 213 lb (96.6 kg)   BP Readings from Last 3 Encounters:  07/18/20 (!) 132/104  06/14/20 138/86  05/13/20 122/88   Pulse Readings from Last 3 Encounters:  07/18/20 66  06/14/20 64  05/13/20 61    Current Outpatient Medications  Medication Sig Dispense Refill   chlorthalidone (HYGROTON) 25 MG tablet Take 1/2 tablet by mouth daily or as directed 45 tablet 3   rosuvastatin (CRESTOR) 10 MG tablet Take 10 mg by mouth once  a week.     amLODipine (NORVASC) 2.5 MG tablet Take 1 tablet (2.5 mg total) by mouth daily. 90 tablet 3   carvedilol (COREG) 3.125 MG tablet Take 1 tablet (3.125 mg total) by mouth 2 (two) times daily. 180 tablet 3   No current facility-administered medications for this visit.    Allergies  Allergen Reactions   Lisinopril Itching    Past Medical History:  Diagnosis Date   Essential hypertension 03/17/2020   Pure hypercholesterolemia 03/17/2020    Blood pressure (!) 132/104, pulse 66, resp. rate 16, height 6\' 1"  (1.854 m), weight 216 lb (98 kg), SpO2 99 %.  Essential hypertension Patient with primary diastolic hypertension, still not controled on low doses of amlodipine and  carvedilol.  Reviewed options for medication that would be efficient at lowering the diastolic.  Chlorthalidone did bring this down, as would an ARB.  Reviewed options with patient, he will re-start the chlorthalidone, but will try 1/2 tablet (12.5 mg) every other day.  Will repeat metabolic panel in 10 days.  Patient agreeable to plan.  Will have him follow up with Dr. in 2 months, (he is aware this will be at the [facility] location).    [facility] PharmD CPP [facility] [address] [place], [place] [phone]

## 2020-07-18 NOTE — Assessment & Plan Note (Signed)
Patient with primary diastolic hypertension, still not controled on low doses of amlodipine and carvedilol.  Reviewed options for medication that would be efficient at lowering the diastolic.  Chlorthalidone did bring this down, as would an ARB.  Reviewed options with patient, he will re-start the chlorthalidone, but will try 1/2 tablet (12.5 mg) every other day.  Will repeat metabolic panel in 10 days.  Patient agreeable to plan.  Will have him follow up with Dr. Duke Salvia in 2 months, (he is aware this will be at the [facility] location).

## 2020-07-18 NOTE — Patient Instructions (Addendum)
We will contact you for a follow up appointment with Dr. Duke Salvia for August  Go to the lab late next week to check kidney function  Check your blood pressure at home daily and keep record of the readings.  Take your BP meds as follows:  Start chlorthalidone 12.5 mg (1/2 tablet) every other day.  Continue with all other medications  Bring all of your meds, your BP cuff and your record of home blood pressures to your next appointment.  Exercise as you're able, try to walk approximately 30 minutes per day.  Keep salt intake to a minimum, especially watch canned and prepared boxed foods.  Eat more fresh fruits and vegetables and fewer canned items.  Avoid eating in fast food restaurants.    HOW TO TAKE YOUR BLOOD PRESSURE: Rest 5 minutes before taking your blood pressure.  Don't smoke or drink caffeinated beverages for at least 30 minutes before. Take your blood pressure before (not after) you eat. Sit comfortably with your back supported and both feet on the floor (don't cross your legs). Elevate your arm to heart level on a table or a desk. Use the proper sized cuff. It should fit smoothly and snugly around your bare upper arm. There should be enough room to slip a fingertip under the cuff. The bottom edge of the cuff should be 1 inch above the crease of the elbow. Ideally, take 3 measurements at one sitting and record the average.

## 2020-08-12 ENCOUNTER — Telehealth (HOSPITAL_BASED_OUTPATIENT_CLINIC_OR_DEPARTMENT_OTHER): Payer: Self-pay | Admitting: *Deleted

## 2020-08-12 NOTE — Telephone Encounter (Signed)
-----   Message from Rosalee Kaufman, RPH-CPP sent at 08/05/2020 10:08 AM EDT ----- Juliette Alcide  He needs an ADV HTN F/U.  Good luck finding a spot!  K ----- Message ----- From: Rosalee Kaufman, RPH-CPP Sent: 07/18/2020  11:29 AM EDT To: Rosalee Kaufman, RPH-CPP  Schedule August f/u with TR ADV HTN at [facility]

## 2020-08-12 NOTE — Telephone Encounter (Signed)
Left message to call back and ask for Selena, I will be out of office next week  Patient is scheduled regular OV and needs to be in ADV HTN follow up

## 2020-08-22 DIAGNOSIS — F4322 Adjustment disorder with anxiety: Secondary | ICD-10-CM | POA: Diagnosis not present

## 2020-08-29 DIAGNOSIS — F4322 Adjustment disorder with anxiety: Secondary | ICD-10-CM | POA: Diagnosis not present

## 2020-09-12 DIAGNOSIS — F4322 Adjustment disorder with anxiety: Secondary | ICD-10-CM | POA: Diagnosis not present

## 2020-09-23 ENCOUNTER — Encounter (HOSPITAL_BASED_OUTPATIENT_CLINIC_OR_DEPARTMENT_OTHER): Payer: Self-pay

## 2020-09-23 MED ORDER — CARVEDILOL 3.125 MG PO TABS: 3.1250 mg | ORAL_TABLET | Freq: Two times a day (BID) | ORAL | 3 refills | Status: DC

## 2020-09-23 MED ORDER — CHLORTHALIDONE 25 MG PO TABS: ORAL_TABLET | ORAL | 3 refills | Status: DC

## 2020-09-23 MED ORDER — AMLODIPINE BESYLATE 2.5 MG PO TABS: 2.5000 mg | ORAL_TABLET | Freq: Every day | ORAL | 3 refills | Status: DC

## 2020-09-26 DIAGNOSIS — F4322 Adjustment disorder with anxiety: Secondary | ICD-10-CM | POA: Diagnosis not present

## 2020-09-30 ENCOUNTER — Ambulatory Visit (HOSPITAL_BASED_OUTPATIENT_CLINIC_OR_DEPARTMENT_OTHER): Payer: BC Managed Care – PPO | Admitting: Cardiovascular Disease

## 2020-10-06 ENCOUNTER — Other Ambulatory Visit: Payer: Self-pay | Admitting: *Deleted

## 2020-10-06 ENCOUNTER — Encounter (HOSPITAL_BASED_OUTPATIENT_CLINIC_OR_DEPARTMENT_OTHER): Payer: Self-pay

## 2020-10-06 ENCOUNTER — Ambulatory Visit (INDEPENDENT_AMBULATORY_CARE_PROVIDER_SITE_OTHER): Payer: BC Managed Care – PPO | Admitting: Cardiovascular Disease

## 2020-10-06 ENCOUNTER — Encounter (HOSPITAL_BASED_OUTPATIENT_CLINIC_OR_DEPARTMENT_OTHER): Payer: Self-pay | Admitting: Cardiovascular Disease

## 2020-10-06 ENCOUNTER — Other Ambulatory Visit: Payer: Self-pay

## 2020-10-06 VITALS — BP 110/90 | HR 58 | Ht 72.0 in | Wt 214.0 lb

## 2020-10-06 DIAGNOSIS — E7800 Pure hypercholesterolemia, unspecified: Secondary | ICD-10-CM | POA: Diagnosis not present

## 2020-10-06 DIAGNOSIS — Z5181 Encounter for therapeutic drug level monitoring: Secondary | ICD-10-CM

## 2020-10-06 DIAGNOSIS — I10 Essential (primary) hypertension: Secondary | ICD-10-CM | POA: Diagnosis not present

## 2020-10-06 MED ORDER — ROSUVASTATIN CALCIUM 10 MG PO TABS: ORAL_TABLET | ORAL | 3 refills | Status: DC

## 2020-10-06 NOTE — Progress Notes (Signed)
Hypertension Clinic Initial Assessment:    Date:  10/06/2020   ID:  Victor Black, DOB 06-23-1985, MRN [id number]  PCP:  Tomasa Hose, NP  Cardiologist:  None  Nephrologist:  Referring MD: Tomasa Hose, NP   CC: Hypertension  History of Present Illness:    Victor Black is a 35 y.o. male with a hx of hypertension and hyperlipidemia here for follow-up.  He first established care in the hypertension clinic 03/2020.  He was first diagnosed with hypertension 6 years prior.  Since then he struggled with intolerances to different medications.  He was initially started on lisinopril but developed pruritus.  This was discontinued and he started on amlodipine.  This controlled his blood pressure well, but he developed lower extremity edema.  He was then switched to diltiazem which did not control his blood pressure.  Most recently he was started on losartan.  He felt poorly on this medication and didn't feel like himself.  He has several family members on his father's side who also struggle with poorly controlled hypertension and strokes.  His cholesterol has been high but not enough to require medications.  At his last appointment he was not tolerating losartan so this was switched to chlorthalidone.  He had renal artery Dopplers that showed mild blockage bilaterally.  Follow-up CTA of the abdomen and pelvis there is no blockage.    He also had a coronary calcium score that was 0.  Testing for hyperaldosteronism was negative.  Thyroid function was normal.  His creatinine increased to 1.47, so chlorthalidone was reduced to 12.5 mg.  He also started taking his rosuvastatin twice per week.  He is due for fasting lipids and a CMP.  He notes that he has been exercising regularly.  He cycles 5 days/week and has no exertional chest pain or shortness of breath.  He has not had any lower extremity edema, orthopnea, or PND.  He is not eating any fried foods and drinking almost and exclusively water.  His  blood pressures at home have been in the 110s to 130s over 70s.  He milligrams headaches and has much more energy.   Previous antihypertensives: Lisinopril- itching Amlodipine-edema Diltiazem-didn't work Losartan-chest pain, dizzy   Past Medical History:  Diagnosis Date   Essential hypertension 03/17/2020   Pure hypercholesterolemia 03/17/2020    History reviewed. No pertinent surgical history.  Current Medications: Current Meds  Medication Sig   amLODipine (NORVASC) 2.5 MG tablet Take 1 tablet (2.5 mg total) by mouth daily.   carvedilol (COREG) 3.125 MG tablet Take 1 tablet (3.125 mg total) by mouth 2 (two) times daily.   chlorthalidone (HYGROTON) 25 MG tablet Take 1/2 tablet by mouth every other night   [DISCONTINUED] chlorthalidone (HYGROTON) 25 MG tablet Take 1/2 tablet by mouth daily or as directed (Patient taking differently: Take 1/2 tablet by mouth every other night)   [DISCONTINUED] rosuvastatin (CRESTOR) 10 MG tablet Take 10 mg by mouth once a week.     Allergies:   Lisinopril   Social History   Socioeconomic History   Marital status: Married    Spouse name: Not on file   Number of children: Not on file   Years of education: Not on file   Highest education level: Not on file  Occupational History   Not on file  Tobacco Use   Smoking status: Never   Smokeless tobacco: Never  Substance and Sexual Activity   Alcohol use: Not on file   Drug use: Not  on file   Sexual activity: Not on file  Other Topics Concern   Not on file  Social History Narrative   Not on file   Social Determinants of Health   Financial Resource Strain: Not on file  Food Insecurity: Not on file  Transportation Needs: Not on file  Physical Activity: Not on file  Stress: Not on file  Social Connections: Not on file     Family History: The patient's family history includes Hypertension in his father and maternal uncle; Stroke in his maternal uncle; Stroke (age of onset: 33) in his  father; Thyroid disease in his mother.  ROS:   Please see the history of present illness.    All other systems reviewed and are negative.  EKGs/Labs/Other Studies Reviewed:    EKG:  EKG is ordered today.  The ekg ordered 03/17/20 demonstrates sinus rhythm.  Rate 62 bpm. 10/06/20: Sinus bradycardia.  Rate 50 beats minute.  Recent Labs: 05/11/2020: BUN 15; Creatinine, Ser 1.43; Potassium 3.8; Sodium 139; TSH 2.020   Recent Lipid Panel No results found for: CHOL, TRIG, HDL, CHOLHDL, VLDL, LDLCALC, LDLDIRECT   04/06/2019: Sodium 144, potassium 4.1, BUN 16, creatinine 1.3, AST 14, ALT 11 EGFR 72 Total cholesterol 206, triglyceride 222, HDL 41, LDL 126  10/2018: Total cholesterol 204, triglycerides 165, HDL 39, LDL 135   Physical Exam:    VS:  BP 110/90   Pulse (!) 58   Ht 6' (1.829 m)   Wt 214 lb (97.1 kg)   BMI 29.02 kg/m  , BMI Body mass index is 29.02 kg/m. GENERAL:  Well appearing HEENT: Pupils equal round and reactive, fundi not visualized, oral mucosa unremarkable NECK:  No jugular venous distention, waveform within normal limits, carotid upstroke brisk and symmetric, no bruits LUNGS:  Clear to auscultation bilaterally HEART:  RRR.  PMI not displaced or sustained,S1 and S2 within normal limits, no S3, no S4, no clicks, no rubs, no murmurs ABD:  Flat, positive bowel sounds normal in frequency in pitch, no bruits, no rebound, no guarding, no midline pulsatile mass, no hepatomegaly, no splenomegaly EXT:  2 plus pulses throughout, no edema, no cyanosis no clubbing SKIN:  No rashes no nodules NEURO:  Cranial nerves II through XII grossly intact, motor grossly intact throughout PSYCH:  Cognitively intact, oriented to person place and time   ASSESSMENT:    1. Essential hypertension   2. Pure hypercholesterolemia   3. Therapeutic drug monitoring      PLAN:    # Hypertension:  Mr. Mauriello underwent a work-up for secondary causes of hypertension that was unremarkable.  I  suspect that his hypertension is genetic.  It does not very well-controlled on amlodipine, carvedilol, and chlorthalidone.  The dose of chlorthalidone was reduced due to AKI.  We will recheck a CMP today.  He was congratulated on his excellent exercise routine and on his diet.  Secondary Causes of Hypertension  Medications/Herbal: OCP, steroids, stimulants, antidepressants, weight loss medication, immune suppressants, NSAIDs, sympathomimetics, alcohol, caffeine, licorice, ginseng, St. John's wort, chemo (none identified)  Sleep Apnea: Testing not indicated Renal artery stenosis: Negative CT of the abdomen and pelvis Hyperaldosteronism: Renin and aldosterone within normal limits Hyper/hypothyroidism: Normal TSH Pheochromocytoma: Testing indicated Cushing's syndrome: Testing not indicated Coarctation of the aorta: Blood pressure symmetric  # Hyperlipidemia:  Lipids are elevated despite healthy lifestyle as above.  Calcium score revealed a calcium score of 0.  Given his family history of premature CAD he is on rosuvastatin twice weekly.  Check fasting lipids today.  Continue with excellent exercise and dietary habits.   Disposition:    FU with MD/PharmD in 1 year    Medication Adjustments/Labs and Tests Ordered: Current medicines are reviewed at length with the patient today.  Concerns regarding medicines are outlined above.  Orders Placed This Encounter  Procedures   Lipid panel   Comprehensive metabolic panel   EKG 09-[facility]    Meds ordered this encounter  Medications   rosuvastatin (CRESTOR) 10 MG tablet    Sig: TAKE 1 TABLET TWICE A WEEK    Dispense:  30 tablet    Refill:  3      Signed, Skeet Latch, MD  10/06/2020 10:56 AM    [place]

## 2020-10-06 NOTE — Telephone Encounter (Signed)
Refills has been resent to pharmacy.

## 2020-10-06 NOTE — Patient Instructions (Addendum)
Medication Instructions:  Your physician recommends that you continue on your current medications as directed. Please refer to the Current Medication list given to you today.   *If you need a refill on your cardiac medications before your next appointment, please call your pharmacy*  Lab Work: FASTING LP/CMET   If you have labs (blood work) drawn today and your tests are completely normal, you will receive your results only by: MyChart Message (if you have MyChart) OR A paper copy in the mail If you have any lab test that is abnormal or we need to change your treatment, we will call you to review the results.  Testing/Procedures: NONE  Follow-Up: At [facility], you and your health needs are our priority.  As part of our continuing mission to provide you with exceptional heart care, we have created designated Provider Care Teams.  These Care Teams include your primary Cardiologist (physician) and Advanced Practice Providers (APPs -  Physician Assistants and Nurse Practitioners) who all work together to provide you with the care you need, when you need it.  We recommend signing up for the patient portal called "MyChart".  Sign up information is provided on this After Visit Summary.  MyChart is used to connect with patients for Virtual Visits (Telemedicine).  Patients are able to view lab/test results, encounter notes, upcoming appointments, etc.  Non-urgent messages can be sent to your provider as well.   To learn more about what you can do with MyChart, go to [email or web address].    Your next appointment:   12 month(s)  The format for your next appointment:   In Person  Provider:   Chilton Si, MD or Gillian Shields, NP  DR Tana Conch IS PCP DISCUSSED

## 2020-10-06 NOTE — Telephone Encounter (Signed)
Patient seen in office today. 

## 2020-10-07 ENCOUNTER — Other Ambulatory Visit (HOSPITAL_BASED_OUTPATIENT_CLINIC_OR_DEPARTMENT_OTHER): Payer: Self-pay | Admitting: *Deleted

## 2020-10-07 DIAGNOSIS — I10 Essential (primary) hypertension: Secondary | ICD-10-CM

## 2020-10-07 DIAGNOSIS — Z5181 Encounter for therapeutic drug level monitoring: Secondary | ICD-10-CM

## 2020-10-07 LAB — COMPREHENSIVE METABOLIC PANEL
ALT: 15 IU/L (ref 0–44)
AST: 18 IU/L (ref 0–40)
Albumin/Globulin Ratio: 1.8 (ref 1.2–2.2)
Albumin: 4.7 g/dL (ref 4.0–5.0)
Alkaline Phosphatase: 86 IU/L (ref 44–121)
BUN/Creatinine Ratio: 10 (ref 9–20)
BUN: 14 mg/dL (ref 6–20)
Bilirubin Total: 0.5 mg/dL (ref 0.0–1.2)
CO2: 25 mmol/L (ref 20–29)
Calcium: 9.9 mg/dL (ref 8.7–10.2)
Chloride: 102 mmol/L (ref 96–106)
Creatinine, Ser: 1.41 mg/dL — ABNORMAL HIGH (ref 0.76–1.27)
Globulin, Total: 2.6 g/dL (ref 1.5–4.5)
Glucose: 88 mg/dL (ref 65–99)
Potassium: 4.2 mmol/L (ref 3.5–5.2)
Sodium: 144 mmol/L (ref 134–144)
Total Protein: 7.3 g/dL (ref 6.0–8.5)
eGFR: 67 mL/min/{1.73_m2} (ref >59)

## 2020-10-07 LAB — LIPID PANEL
Chol/HDL Ratio: 3.4 ratio (ref 0.0–5.0)
Cholesterol, Total: 124 mg/dL (ref 100–199)
HDL: 36 mg/dL — ABNORMAL LOW (ref >39)
LDL Chol Calc (NIH): 72 mg/dL (ref 0–99)
Triglycerides: 83 mg/dL (ref 0–149)
VLDL Cholesterol Cal: 16 mg/dL (ref 5–40)

## 2020-10-10 DIAGNOSIS — F4322 Adjustment disorder with anxiety: Secondary | ICD-10-CM | POA: Diagnosis not present

## 2021-07-18 ENCOUNTER — Other Ambulatory Visit (HOSPITAL_BASED_OUTPATIENT_CLINIC_OR_DEPARTMENT_OTHER): Payer: Self-pay | Admitting: Cardiovascular Disease

## 2021-07-18 NOTE — Telephone Encounter (Signed)
Rx request sent to pharmacy.  

## 2021-10-11 ENCOUNTER — Other Ambulatory Visit: Payer: Self-pay | Admitting: Cardiovascular Disease

## 2021-10-12 NOTE — Telephone Encounter (Signed)
Rx request sent to pharmacy.  

## 2022-03-07 IMAGING — CT CT ANGIO ABDOMEN
3 of 10 series · 12 of 46 positions shown, 18 images · IV contrast (OMNIPAQUE 350)
Comparison: CT of the abdomen and pelvis without contrast on
01/08/2004

CLINICAL DATA: Uncontrolled hypertension.

EXAM:
CT ANGIOGRAPHY ABDOMEN
TECHNIQUE: Multidetector CT imaging of the abdomen was performed using the
standard protocol during bolus administration of intravenous
contrast. Multiplanar reconstructed images and MIPs were obtained
and reviewed to evaluate the vascular anatomy.
CONTRAST:  100mL OMNIPAQUE IOHEXOL 350 MG/ML SOLN

[Series 4: arterial 3.0 br40 2 · axial · arterial · 0.67mm/px · z∈[+905,+1004]mm · 4 of 92 slices shown]
[im 7/92  soft-tissue]
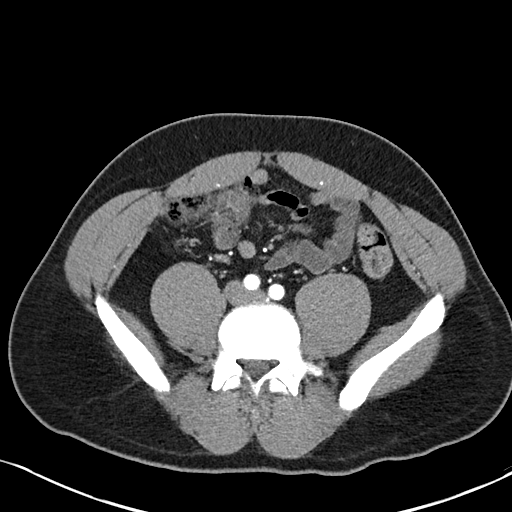
[im 20/92  soft-tissue]
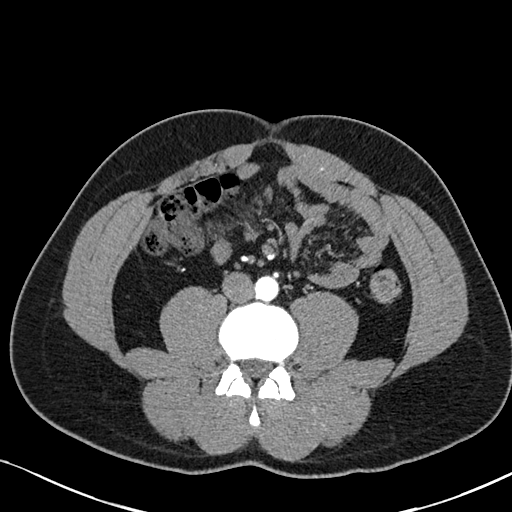
[im 33/92  soft-tissue]
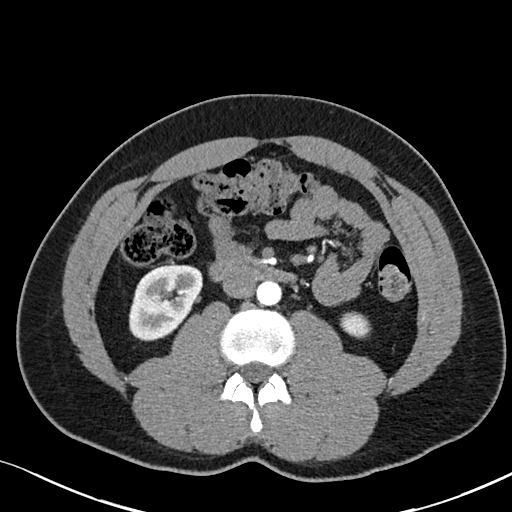
[im 40/92  soft-tissue]
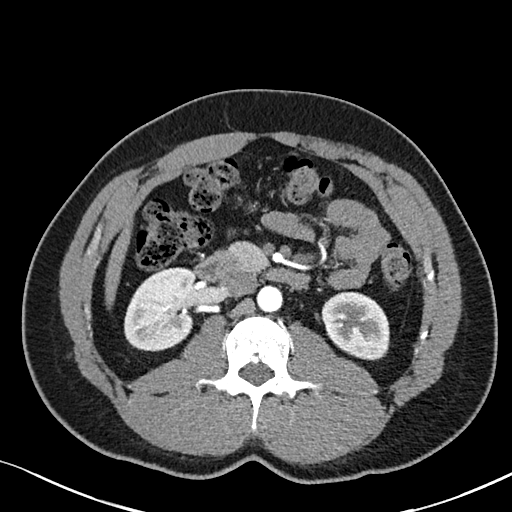

[Series 7: coronals · coronal · 0.56mm/px · 2 of 132 slices shown, 3 images]
[im 44/132  soft-tissue]
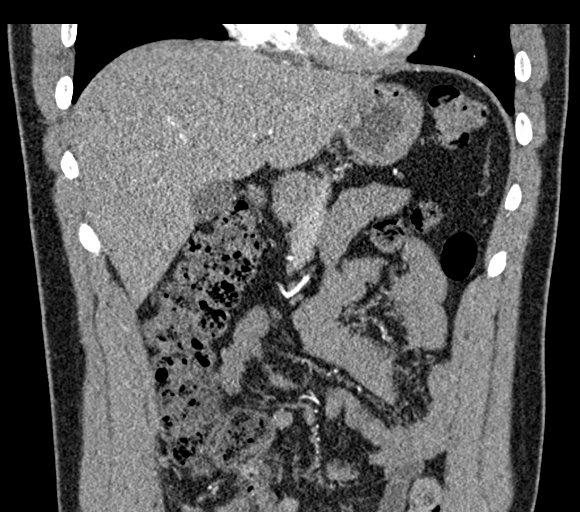
[im 44/132  bone]
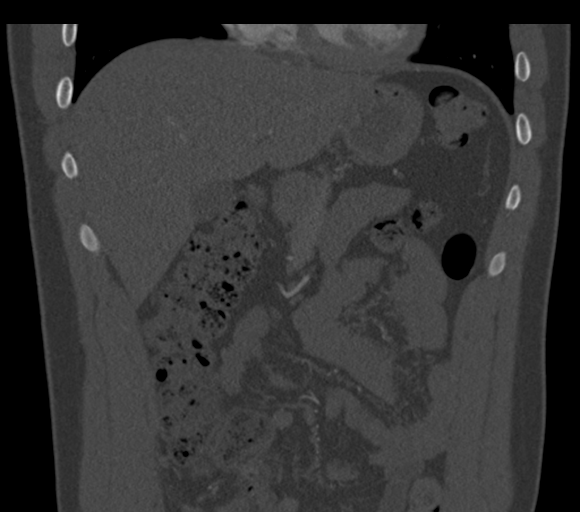
[im 88/132  soft-tissue]
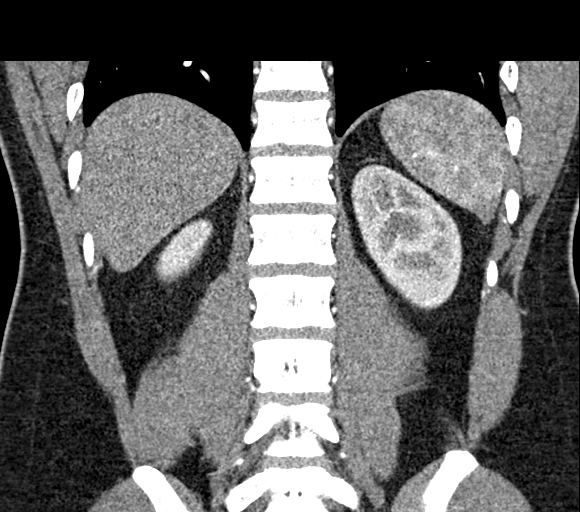

[Series 11: venous 5.0 br40 2 · axial · portal-venous · 0.65mm/px · z∈[+923,+1118]mm · 6 of 55 slices shown, 11 images]
[im 8/55  soft-tissue]
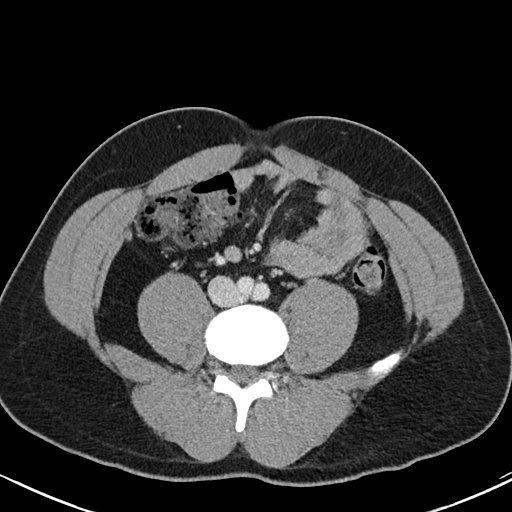
[im 8/55  bone]
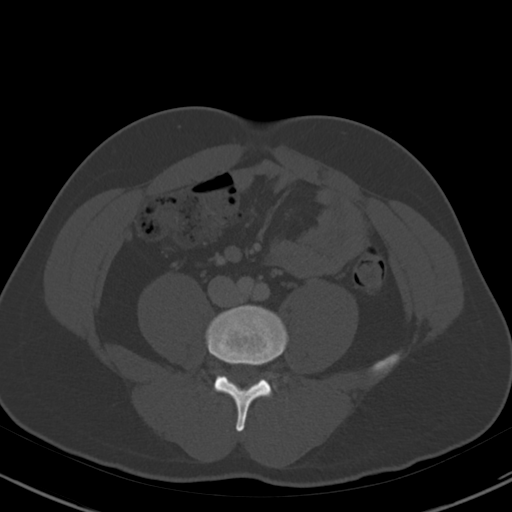
[im 16/55  soft-tissue]
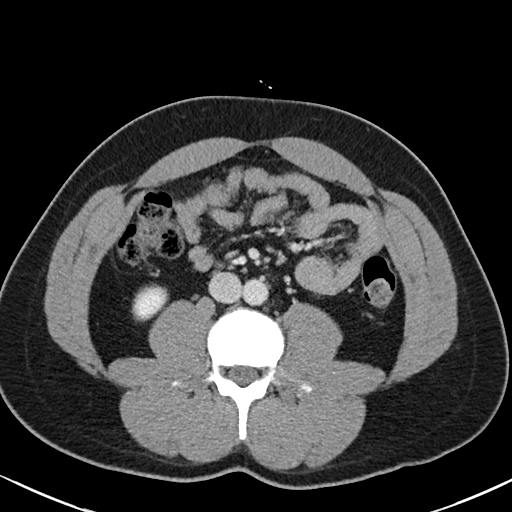
[im 24/55  soft-tissue]
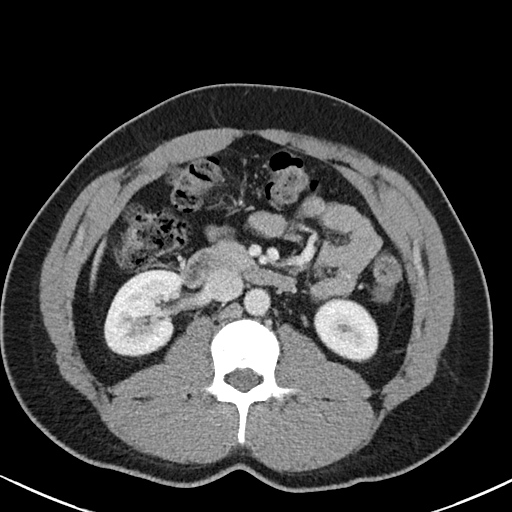
[im 24/55  lung]
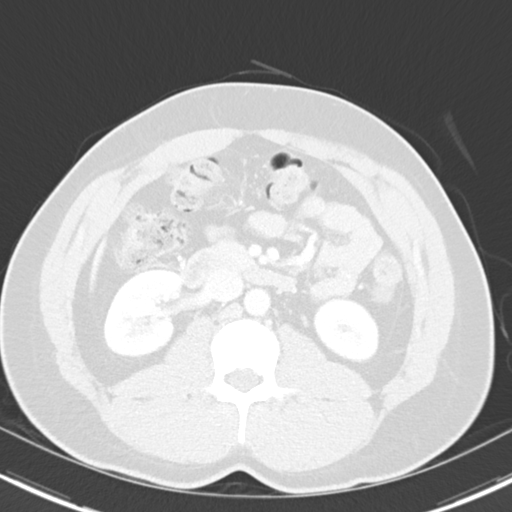
[im 31/55  soft-tissue]
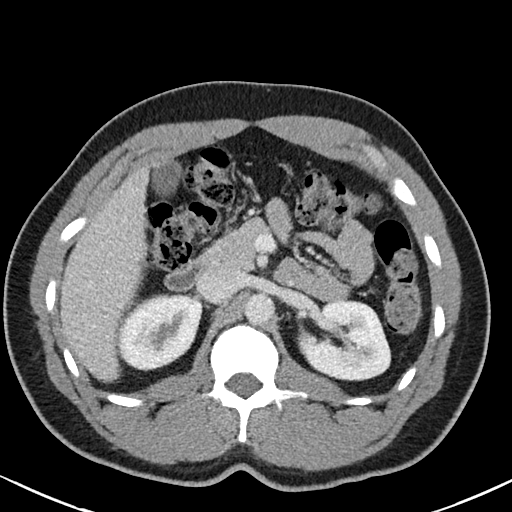
[im 31/55  lung]
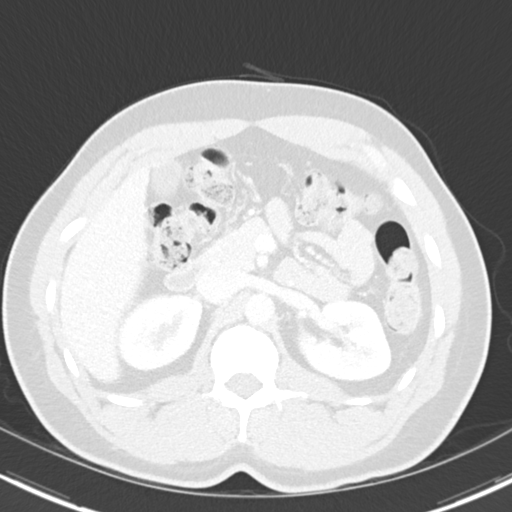
[im 39/55  soft-tissue]
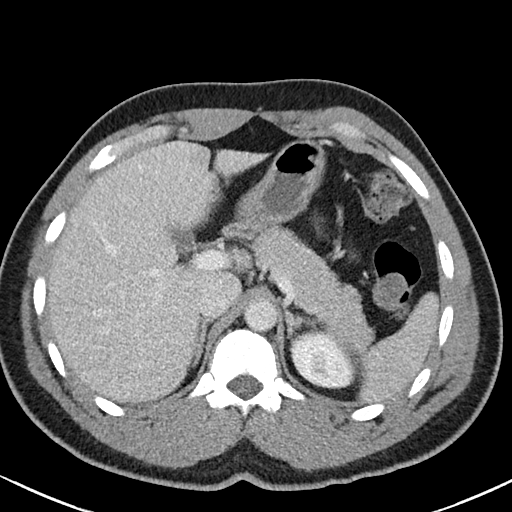
[im 39/55  lung]
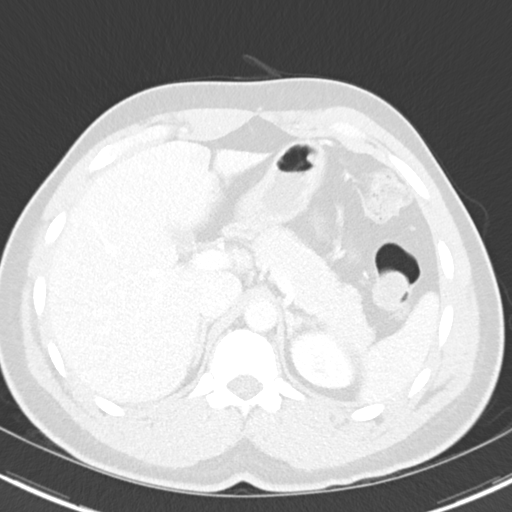
[im 47/55  soft-tissue]
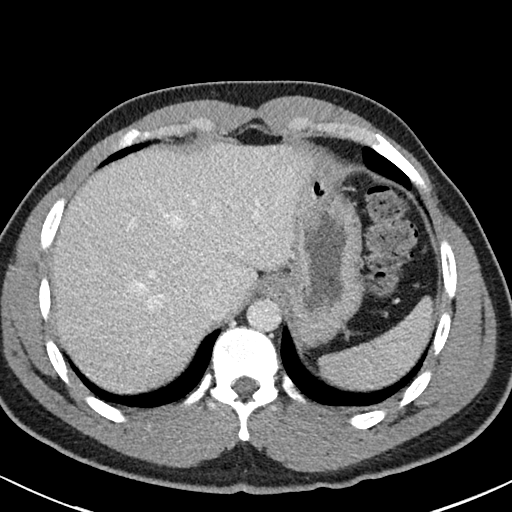
[im 47/55  lung]
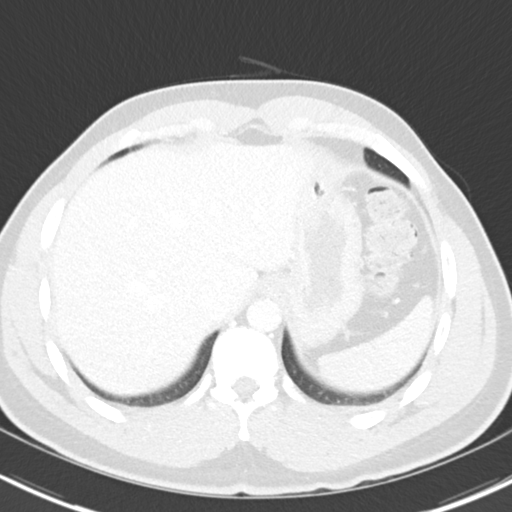

[12 of 46 positions shown; findings below may reference images not displayed]

FINDINGS: VASCULAR

Aorta: The abdominal aorta has a normal appearance by CTA with no
evidence of atherosclerosis, aneurysm, dissection or vasculitis.

Celiac: Normally patent. Normally patent branch vessels and branch
vessel anatomy.

SMA: Normally patent.

Renals: Bilateral single renal arteries demonstrate normal patency
without evidence of stenosis, fibromuscular dysplasia or dissection.
No plaque identified in the renal arteries.

IMA: Normally patent.

Inflow: Visualized segments of the common iliac arteries demonstrate
normal patency bilaterally.

Veins: Venous phase imaging was also performed demonstrating normal
patency of the IVC, bilateral renal veins and iliac veins.
Visualized mesenteric veins, splenic vein and portal vein are
normally patent.

Review of the MIP images confirms the above findings.

NON-VASCULAR

Lower chest: No acute abnormality.

Hepatobiliary: No focal liver abnormality is seen. No gallstones,
gallbladder wall thickening, or biliary dilatation.

Pancreas: Unremarkable. No pancreatic ductal dilatation or
surrounding inflammatory changes.

Spleen: Normal in size without focal abnormality.

Adrenals/Urinary Tract: Adrenal glands are unremarkable. Kidneys are
normal, without renal calculi, focal lesion, or hydronephrosis.
Bladder is unremarkable.

Stomach/Bowel: Visualized bowel demonstrates no evidence of
obstruction, inflammation or lesion. No free intraperitoneal air
identified.

Lymphatic: No enlarged lymph nodes identified in the abdomen.

Other: No abdominal wall hernia or abnormality. No fluid collection
or ascites.

Musculoskeletal: No acute or significant osseous findings.
IMPRESSION: Normal CTA of the abdomen and pelvis. No evidence of focal renal
artery stenosis or fibromuscular dysplasia.

## 2022-03-30 ENCOUNTER — Telehealth (HOSPITAL_BASED_OUTPATIENT_CLINIC_OR_DEPARTMENT_OTHER): Payer: Self-pay | Admitting: *Deleted

## 2022-03-30 DIAGNOSIS — Z5181 Encounter for therapeutic drug level monitoring: Secondary | ICD-10-CM

## 2022-03-30 DIAGNOSIS — Z8739 Personal history of other diseases of the musculoskeletal system and connective tissue: Secondary | ICD-10-CM

## 2022-03-30 DIAGNOSIS — E7800 Pure hypercholesterolemia, unspecified: Secondary | ICD-10-CM

## 2022-03-30 DIAGNOSIS — I10 Essential (primary) hypertension: Secondary | ICD-10-CM

## 2022-03-30 DIAGNOSIS — E790 Hyperuricemia without signs of inflammatory arthritis and tophaceous disease: Secondary | ICD-10-CM

## 2022-03-30 NOTE — Telephone Encounter (Signed)
Patient called reporting a second episode of gout.  This is likely related to his thiazide diuretic.  He will hold chlorthalidone and track BP at home.  Bring to follow up.  He will come for fasting lipids/CMP and uric acid levels.  We will arrange for follow up.  Leron Stoffers C. Oval Linsey, MD, [facility] 03/30/2022 4:24 PM

## 2022-03-30 NOTE — Telephone Encounter (Signed)
patient called me about some gout flares. I told him to stop chlorthalidone and track his BP. He will come next week sometime for fasting lipids/CMP/ uric acid level. Could you please help me order that and schedule him to see Erasmo Downer or Urban Gibson? My schedule is crazy for the next several weeks. I just don't see where I can fit him in   Labs ordered Patient to monitor blood pressure at home and will follow up next week with readings  No available appointments with APP or Pharm D for the next 2 weeks

## 2022-04-02 DIAGNOSIS — E7800 Pure hypercholesterolemia, unspecified: Secondary | ICD-10-CM | POA: Diagnosis not present

## 2022-04-02 DIAGNOSIS — Z8739 Personal history of other diseases of the musculoskeletal system and connective tissue: Secondary | ICD-10-CM | POA: Diagnosis not present

## 2022-04-02 DIAGNOSIS — I10 Essential (primary) hypertension: Secondary | ICD-10-CM | POA: Diagnosis not present

## 2022-04-02 DIAGNOSIS — Z5181 Encounter for therapeutic drug level monitoring: Secondary | ICD-10-CM | POA: Diagnosis not present

## 2022-04-02 NOTE — Telephone Encounter (Signed)
Victor Black had cancellation today, called to see if patient could come in  Left message to call back

## 2022-04-03 LAB — COMPREHENSIVE METABOLIC PANEL
ALT: 10 IU/L (ref 0–44)
AST: 13 IU/L (ref 0–40)
Albumin/Globulin Ratio: 2 (ref 1.2–2.2)
Albumin: 4.6 g/dL (ref 4.1–5.1)
Alkaline Phosphatase: 86 IU/L (ref 44–121)
BUN/Creatinine Ratio: 13 (ref 9–20)
BUN: 17 mg/dL (ref 6–20)
Bilirubin Total: 0.6 mg/dL (ref 0.0–1.2)
CO2: 24 mmol/L (ref 20–29)
Calcium: 9.9 mg/dL (ref 8.7–10.2)
Chloride: 103 mmol/L (ref 96–106)
Creatinine, Ser: 1.35 mg/dL — ABNORMAL HIGH (ref 0.76–1.27)
Globulin, Total: 2.3 g/dL (ref 1.5–4.5)
Glucose: 86 mg/dL (ref 70–99)
Potassium: 4.2 mmol/L (ref 3.5–5.2)
Sodium: 141 mmol/L (ref 134–144)
Total Protein: 6.9 g/dL (ref 6.0–8.5)
eGFR: 70 mL/min/{1.73_m2} (ref >59)

## 2022-04-03 LAB — LIPID PANEL
Chol/HDL Ratio: 4.6 ratio (ref 0.0–5.0)
Cholesterol, Total: 146 mg/dL (ref 100–199)
HDL: 32 mg/dL — ABNORMAL LOW (ref >39)
LDL Chol Calc (NIH): 92 mg/dL (ref 0–99)
Triglycerides: 123 mg/dL (ref 0–149)
VLDL Cholesterol Cal: 22 mg/dL (ref 5–40)

## 2022-04-03 LAB — URIC ACID: Uric Acid: 9.6 mg/dL — ABNORMAL HIGH (ref 3.8–8.4)

## 2022-04-19 NOTE — Addendum Note (Signed)
Addended by: Alvina Filbert B on: 04/19/2022 06:07 PM   Modules accepted: Orders

## 2022-04-19 NOTE — Telephone Encounter (Signed)
Patient scheduled for 4/7 with Dr Oval Linsey , will keep as scheduled

## 2022-04-19 NOTE — Telephone Encounter (Signed)
Patient never viewed labs or returned call Mailed labs, order for repeat labs, and note to call the office to schedule follow up

## 2022-04-19 NOTE — Telephone Encounter (Signed)
-----   Message from Skeet Latch, MD sent at 04/04/2022  9:12 AM EST ----- Cholesterol levels are very good.  Kidney function continues to improve.  Liver function and electrolytes are normal.  Uric acid level is elevated.  Repeat uric acid level in 1 month.

## 2022-05-08 ENCOUNTER — Ambulatory Visit (HOSPITAL_BASED_OUTPATIENT_CLINIC_OR_DEPARTMENT_OTHER): Payer: BC Managed Care – PPO | Admitting: Cardiovascular Disease

## 2022-05-08 ENCOUNTER — Encounter (HOSPITAL_BASED_OUTPATIENT_CLINIC_OR_DEPARTMENT_OTHER): Payer: Self-pay | Admitting: Cardiovascular Disease

## 2022-05-08 VITALS — BP 132/88 | HR 54 | Ht 72.0 in | Wt 203.5 lb

## 2022-05-08 DIAGNOSIS — E7800 Pure hypercholesterolemia, unspecified: Secondary | ICD-10-CM | POA: Diagnosis not present

## 2022-05-08 DIAGNOSIS — I10 Essential (primary) hypertension: Secondary | ICD-10-CM | POA: Diagnosis not present

## 2022-05-08 DIAGNOSIS — M109 Gout, unspecified: Secondary | ICD-10-CM

## 2022-05-08 DIAGNOSIS — M1020 Drug-induced gout, unspecified site: Secondary | ICD-10-CM | POA: Diagnosis not present

## 2022-05-08 HISTORY — DX: Gout, unspecified: M10.9

## 2022-05-08 NOTE — Assessment & Plan Note (Signed)
Coronary calcium score was 0 in 2022.  Continue management with diet and exercise.  Consider repeating calcium score in 5 years.

## 2022-05-08 NOTE — Assessment & Plan Note (Signed)
He had recurrent episodes of gout after starting chlorthalidone.  Uric acid levels were still elevated soon after discontinuing the medication.  Will repeat today.  Hopefully he will not need to take prophylactic amlodipine.

## 2022-05-08 NOTE — Patient Instructions (Addendum)
Medication Instructions:  Your physician recommends that you continue on your current medications as directed. Please refer to the Current Medication list given to you today.   *If you need a refill on your cardiac medications before your next appointment, please call your pharmacy*   Lab Work: URIC ACID TODAY   If you have labs (blood work) drawn today and your tests are completely normal, you will receive your results only by: MyChart Message (if you have MyChart) OR A paper copy in the mail If you have any lab test that is abnormal or we need to change your treatment, we will call you to review the results.   Testing/Procedures: NONE    Follow-Up: 08/06/2022 10:00 AM WITH DR [facility]

## 2022-05-08 NOTE — Progress Notes (Signed)
Hypertension Clinic Follow Up:    Date:  05/08/2022   ID:  Victor Black, DOB Nov 18, 1985, MRN [id number]  PCP:  Jettie PaganFalstreau, Brooke A, NP  Cardiologist:  Chilton Siiffany Coy, MD   Referring MD: Jettie PaganFalstreau, Brooke A, NP   CC: Hypertension  History of Present Illness:    Victor BoughJustin Black is a 37 y.o. male with a hx of hypertension and hyperlipidemia here for follow-up.  He first established care in the hypertension clinic 03/2020.  He was first diagnosed with hypertension 6 years prior.  Since then he struggled with intolerances to different medications.  He was initially started on lisinopril but developed pruritus.  This was discontinued and he started on amlodipine.  This controlled his blood pressure well, but he developed lower extremity edema.  He was then switched to diltiazem which did not control his blood pressure.  Most recently he was started on losartan.  He felt poorly on this medication and didn't feel like himself.  He has several family members on his father's side who also struggle with poorly controlled hypertension and strokes.  His cholesterol has been high but not enough to require medications.  At his appointment 03/2020 he was not tolerating losartan so this was switched to chlorthalidone.  He had renal artery Dopplers that showed mild blockage bilaterally.  Follow-up CTA of the abdomen and pelvis demonstrated no blockage.  He also had a coronary calcium score 03/2020 that was 0.  Testing for hyperaldosteronism was negative.  Thyroid function was normal.  His creatinine increased to 1.47, so chlorthalidone was reduced to 12.5 mg.  He also started taking his rosuvastatin twice per week.  He subsequently discontinued it.   During his most recent visit 09/2020 he had been more active cycling 5 days a week with no exertional symptoms. He had improved his diet, and was exclusively drinking water. His blood pressures had also improved. He called and reported recurrent episodes of gout so  chlorthalidone was discontinued. Uric acid level was elevated to 9.6  Today, the patient states that he had one other episode of gout two weeks after stopping chlorthalidone. His blood pressure has been around 110-120 but he noticed that his diastolic pressure has been elevated around 85-95. He notices that the more he exercises the lower his diastolic pressure. He hasn't been exercising less recently due to work.  He denies any palpitations, chest pain, shortness of breath, or peripheral edema. No lightheadedness, headaches, syncope, orthopnea, or PND.  Previous antihypertensives: Lisinopril- itching Amlodipine-edema Diltiazem-didn't work Losartan-chest pain, dizzy Chlorthalidone- gout   Past Medical History:  Diagnosis Date   Essential hypertension 03/17/2020   Gout 05/08/2022   Pure hypercholesterolemia 03/17/2020    History reviewed. No pertinent surgical history.  Current Medications: Current Meds  Medication Sig   amLODipine (NORVASC) 2.5 MG tablet TAKE 1 TABLET DAILY   carvedilol (COREG) 3.125 MG tablet TAKE 1 TABLET TWICE A DAY     Allergies:   Lisinopril and Chlorthalidone   Social History   Socioeconomic History   Marital status: Married    Spouse name: Not on file   Number of children: Not on file   Years of education: Not on file   Highest education level: Not on file  Occupational History   Not on file  Tobacco Use   Smoking status: Never   Smokeless tobacco: Never  Substance and Sexual Activity   Alcohol use: Not on file   Drug use: Not on file   Sexual activity: Not on  file  Other Topics Concern   Not on file  Social History Narrative   Not on file   Social Determinants of Health   Financial Resource Strain: Not on file  Food Insecurity: Not on file  Transportation Needs: Not on file  Physical Activity: Not on file  Stress: Not on file  Social Connections: Not on file     Family History: The patient's family history includes Hypertension  in his father and maternal uncle; Stroke in his maternal uncle; Stroke (age of onset: 3) in his father; Thyroid disease in his mother.  ROS:   Please see the history of present illness.     All other systems reviewed and are negative.  EKGs/Labs/Other Studies Reviewed:    EKG:  EKG is personally reviewed 05/08/2022: Sinus bradycardia Rate 54 bpm. 03/17/20: sinus rhythm.  Rate 62 bpm. 10/06/20: Sinus bradycardia.  Rate 50 beats minute.  Recent Labs: 04/02/2022: ALT 10; BUN 17; Creatinine, Ser 1.35; Potassium 4.2; Sodium 141   Recent Lipid Panel    Component Value Date/Time   CHOL 146 04/02/2022 0909   TRIG 123 04/02/2022 0909   HDL 32 (L) 04/02/2022 0909   CHOLHDL 4.6 04/02/2022 0909   LDLCALC 92 04/02/2022 0909     04/06/2019: Sodium 144, potassium 4.1, BUN 16, creatinine 1.3, AST 14, ALT 11 EGFR 72 Total cholesterol 206, triglyceride 222, HDL 41, LDL 126  10/2018: Total cholesterol 204, triglycerides 165, HDL 39, LDL 135   Physical Exam:    VS:  BP 132/88 (BP Location: Right Arm, Patient Position: Sitting, Cuff Size: Normal)   Pulse (!) 54   Ht 6' (1.829 m)   Wt 203 lb 8 oz (92.3 kg)   BMI 27.60 kg/m  , BMI Body mass index is 27.6 kg/m. GENERAL:  Well appearing HEENT: Pupils equal round and reactive, fundi not visualized, oral mucosa unremarkable NECK:  No jugular venous distention, waveform within normal limits, carotid upstroke brisk and symmetric, no bruits LUNGS:  Clear to auscultation bilaterally HEART:  RRR.  PMI not displaced or sustained,S1 and S2 within normal limits, no S3, no S4, no clicks, no rubs, no murmurs ABD:  Flat, positive bowel sounds normal in frequency in pitch, no bruits, no rebound, no guarding, no midline pulsatile mass, no hepatomegaly, no splenomegaly EXT:  2 plus pulses throughout, no edema, no cyanosis no clubbing SKIN:  No rashes no nodules NEURO:  Cranial nerves II through XII grossly intact, motor grossly intact throughout PSYCH:   Cognitively intact, oriented to person place and time   ASSESSMENT:    1. Essential hypertension   2. Pure hypercholesterolemia   3. Drug-induced gout, unspecified chronicity, unspecified site       PLAN:    Essential hypertension Systolic blood pressures are generally well-controlled.  His diastolic blood pressures have been elevated.  Systolic blood pressures are generally controlled.  He notes that his diastolic blood pressures improved with exercise and he has not been exercising much lately due to work stress.  For now, we will continue with amlodipine and carvedilol.  Chlorthalidone discontinued due to recurrent gout.  He will keep checking his pressures at home and we will reassess in 3 to 4 months.  We did also discussed renal denervation as an option given his multiple medication intolerances.  Could also consider doxazosin.  Pure hypercholesterolemia Coronary calcium score was 0 in 2022.  Continue management with diet and exercise.  Consider repeating calcium score in 5 years.  Gout He had recurrent  episodes of gout after starting chlorthalidone.  Uric acid levels were still elevated soon after discontinuing the medication.  Will repeat today.  Hopefully he will not need to take prophylactic amlodipine.   Disposition:     FU Raydon Chappuis C. Duke Salvia, MD, [facility] in 3-4 months   Medication Adjustments/Labs and Tests Ordered: Current medicines are reviewed at length with the patient today.  Concerns regarding medicines are outlined above.  Orders Placed This Encounter  Procedures   Uric acid   EKG 12-Lead    No orders of the defined types were placed in this encounter.  I,Coren O'Brien,acting as a Neurosurgeon for [employer], MD.,have documented all relevant documentation on the behalf of Chilton Si, MD,as directed by  Chilton Si, MD while in the presence of Chilton Si, MD.  I, Keelon Zurn C. Duke Salvia, MD have reviewed all documentation for this visit.  The  documentation of the exam, diagnosis, procedures, and orders on 05/08/2022 are all accurate and complete.

## 2022-05-08 NOTE — Assessment & Plan Note (Signed)
Systolic blood pressures are generally well-controlled.  His diastolic blood pressures have been elevated.  Systolic blood pressures are generally controlled.  He notes that his diastolic blood pressures improved with exercise and he has not been exercising much lately due to work stress.  For now, we will continue with amlodipine and carvedilol.  Chlorthalidone discontinued due to recurrent gout.  He will keep checking his pressures at home and we will reassess in 3 to 4 months.  We did also discussed renal denervation as an option given his multiple medication intolerances.  Could also consider doxazosin.

## 2022-05-09 LAB — URIC ACID: Uric Acid: 8.8 mg/dL — ABNORMAL HIGH (ref 3.8–8.4)

## 2022-08-04 ENCOUNTER — Other Ambulatory Visit (HOSPITAL_BASED_OUTPATIENT_CLINIC_OR_DEPARTMENT_OTHER): Payer: Self-pay | Admitting: Cardiovascular Disease

## 2022-08-06 ENCOUNTER — Encounter (HOSPITAL_BASED_OUTPATIENT_CLINIC_OR_DEPARTMENT_OTHER): Payer: Self-pay | Admitting: Cardiovascular Disease

## 2022-08-06 ENCOUNTER — Ambulatory Visit (HOSPITAL_BASED_OUTPATIENT_CLINIC_OR_DEPARTMENT_OTHER): Payer: BC Managed Care – PPO | Admitting: Cardiovascular Disease

## 2022-08-06 VITALS — BP 126/92 | HR 70 | Ht 72.0 in | Wt 210.6 lb

## 2022-08-06 DIAGNOSIS — E7800 Pure hypercholesterolemia, unspecified: Secondary | ICD-10-CM | POA: Diagnosis not present

## 2022-08-06 DIAGNOSIS — I10 Essential (primary) hypertension: Secondary | ICD-10-CM

## 2022-08-06 NOTE — Progress Notes (Signed)
Cardiology Office Note:  .   Date:  08/06/2022  ID:  Victor Black, DOB [birth year], MRN [id number] PCP: Jettie Pagan, NP  [facility] Providers Cardiologist:  Chilton Si, MD    History of Present Illness: .   Victor Black is a 37 y.o. male with a hx of hypertension and hyperlipidemia here for follow-up.  He first established care in the hypertension clinic 03/2020.  He was first diagnosed with hypertension 6 years prior.  Since then he struggled with intolerances to different medications.  He was initially started on lisinopril but developed pruritus.  This was discontinued and he started on amlodipine.  This controlled his blood pressure well, but he developed lower extremity edema.  He was then switched to diltiazem which did not control his blood pressure.  Most recently he was started on losartan.  He felt poorly on this medication and didn't feel like himself.  He has several family members on his father's side who also struggle with poorly controlled hypertension and strokes.  His cholesterol has been high but not enough to require medications.   At his appointment 03/2020 he was not tolerating losartan so this was switched to chlorthalidone.  He had renal artery Dopplers that showed mild blockage bilaterally.  Follow-up CTA of the abdomen and pelvis demonstrated no blockage.  He also had a coronary calcium score 03/2020 that was 0.  Testing for hyperaldosteronism was negative.  Thyroid function was normal.  His creatinine increased to 1.47, so chlorthalidone was reduced to 12.5 mg.  He also started taking his rosuvastatin twice per week.  He subsequently discontinued it.    During his most recent visit 09/2020 he had been more active cycling 5 days a week with no exertional symptoms. He had improved his diet, and was exclusively drinking water. His blood pressures had also improved. He called and reported recurrent episodes of gout so chlorthalidone was discontinued. Uric acid level  was elevated to 9.6 and reduced 8.8 after discontinuation.  DBP was mildly elevated aft follow up but he was maintained on amlodipine and carvedilol with continued diet and exercise.   Prior antihypertensives: Lisinopril- pruritis Amlodipine- LE edema Diltiazem-ineffective Losartan-felt poorly Chlorthalidone- gout  ROS:  No symptoms  Studies Reviewed: Marland Kitchen       Calcium score 03/2020: 0  Risk Assessment/Calculations:     HYPERTENSION CONTROL Vitals:   08/06/22 1011 08/06/22 1043  BP: (!) 129/90 (!) 126/92    The patient's blood pressure is elevated above target today.  In order to address the patient's elevated BP: Blood pressure will be monitored at home to determine if medication changes need to be made.          Physical Exam:   VS:  BP (!) 126/92   Pulse 70   Ht 6' (1.829 m)   Wt 210 lb 9.6 oz (95.5 kg)   SpO2 98%   BMI 28.56 kg/m  , BMI Body mass index is 28.56 kg/m. GENERAL:  Well appearing HEENT: Pupils equal round and reactive, fundi not visualized, oral mucosa unremarkable NECK:  No jugular venous distention, waveform within normal limits, carotid upstroke brisk and symmetric, no bruits, no thyromegaly LUNGS:  Clear to auscultation bilaterally HEART:  RRR.  PMI not displaced or sustained,S1 and S2 within normal limits, no S3, no S4, no clicks, no rubs, no murmurs ABD:  Flat, positive bowel sounds normal in frequency in pitch, no bruits, no rebound, no guarding, no midline pulsatile mass, no hepatomegaly, no splenomegaly  EXT:  2 plus pulses throughout, no edema, no cyanosis no clubbing SKIN:  No rashes no nodules NEURO:  Cranial nerves II through XII grossly intact, motor grossly intact throughout PSYCH:  Cognitively intact, oriented to person place and time   ASSESSMENT AND PLAN: .       # Hypertension: Blood pressure readings in the mid-120s systolic and 84-92 diastolic. Patient reports correlation between exercise and lower blood pressure readings. No  current medication for hypertension. -Encouraged to maintain regular exercise regimen and monitor blood pressure. -Report if diastolic readings consistently over 90 even after resuming exercise. - Consider doxazosin or spironolactone.  He is open to RDN in the future if additional medications were needed.  # Hyperlipidemia: Currently on Rosuvastatin, tolerating well. -Continue Rosuvastatin. -He will return for lipids/CMP and LP(a)  General Health Maintenance: -Annual eye exams recommended for retinopathy screening due to hypertension. -Regular kidney function tests to monitor for hypertension-related kidney damage. -Follow-up in 1 year, or sooner if blood pressure readings consistently over target.       Dispo: Dianne Whelchel C. Duke Salvia, MD, [facility] in 1 year  Signed, Chilton Si, MD

## 2022-08-06 NOTE — Telephone Encounter (Signed)
Rx(s) sent to pharmacy electronically.  

## 2022-08-06 NOTE — Patient Instructions (Signed)
Medication Instructions:  Your physician recommends that you continue on your current medications as directed. Please refer to the Current Medication list given to you today.   *If you need a refill on your cardiac medications before your next appointment, please call your pharmacy*  Lab Work: FASTING LP/CMET/LPa SOON   If you have labs (blood work) drawn today and your tests are completely normal, you will receive your results only by: MyChart Message (if you have MyChart) OR A paper copy in the mail If you have any lab test that is abnormal or we need to change your treatment, we will call you to review the results.  Testing/Procedures: NONE  Follow-Up: At [facility], you and your health needs are our priority.  As part of our continuing mission to provide you with exceptional heart care, we have created designated Provider Care Teams.  These Care Teams include your primary Cardiologist (physician) and Advanced Practice Providers (APPs -  Physician Assistants and Nurse Practitioners) who all work together to provide you with the care you need, when you need it.  We recommend signing up for the patient portal called "MyChart".  Sign up information is provided on this After Visit Summary.  MyChart is used to connect with patients for Virtual Visits (Telemedicine).  Patients are able to view lab/test results, encounter notes, upcoming appointments, etc.  Non-urgent messages can be sent to your provider as well.   To learn more about what you can do with MyChart, go to [email or web address].    Your next appointment:   12 month(s)  Provider:   Chilton Si, MD or Gillian Shields, NP    Other Instructions MONITOR BLOOD PRESSURE, IF BOTTOM NUMBER REMAINS ABOVE 90 WHEN YOU GET BACK TO EXERCISING CALL THE OFFICE TO FOLLOW UP

## 2022-08-22 ENCOUNTER — Other Ambulatory Visit (HOSPITAL_BASED_OUTPATIENT_CLINIC_OR_DEPARTMENT_OTHER): Payer: Self-pay | Admitting: Cardiovascular Disease

## 2022-10-10 DIAGNOSIS — H40003 Preglaucoma, unspecified, bilateral: Secondary | ICD-10-CM | POA: Diagnosis not present

## 2023-04-19 DIAGNOSIS — R0602 Shortness of breath: Secondary | ICD-10-CM | POA: Diagnosis not present

## 2023-04-19 DIAGNOSIS — Z20822 Contact with and (suspected) exposure to covid-19: Secondary | ICD-10-CM | POA: Diagnosis not present

## 2023-04-19 DIAGNOSIS — R110 Nausea: Secondary | ICD-10-CM | POA: Diagnosis not present

## 2023-04-19 DIAGNOSIS — R1031 Right lower quadrant pain: Secondary | ICD-10-CM | POA: Diagnosis not present

## 2023-04-19 DIAGNOSIS — R197 Diarrhea, unspecified: Secondary | ICD-10-CM | POA: Diagnosis not present

## 2023-04-24 ENCOUNTER — Other Ambulatory Visit (HOSPITAL_BASED_OUTPATIENT_CLINIC_OR_DEPARTMENT_OTHER): Payer: Self-pay | Admitting: Cardiovascular Disease

## 2023-05-12 ENCOUNTER — Other Ambulatory Visit (HOSPITAL_BASED_OUTPATIENT_CLINIC_OR_DEPARTMENT_OTHER): Payer: Self-pay | Admitting: Cardiovascular Disease

## 2023-08-16 ENCOUNTER — Encounter: Payer: Self-pay | Admitting: Advanced Practice Midwife

## 2023-10-21 ENCOUNTER — Other Ambulatory Visit (HOSPITAL_BASED_OUTPATIENT_CLINIC_OR_DEPARTMENT_OTHER): Payer: Self-pay | Admitting: Cardiovascular Disease

## 2024-01-12 ENCOUNTER — Other Ambulatory Visit (HOSPITAL_BASED_OUTPATIENT_CLINIC_OR_DEPARTMENT_OTHER): Payer: Self-pay | Admitting: Cardiovascular Disease

## 2024-01-21 ENCOUNTER — Other Ambulatory Visit: Payer: Self-pay | Admitting: Cardiovascular Disease

## 2024-01-22 MED ORDER — AMLODIPINE BESYLATE 2.5 MG PO TABS: 2.5000 mg | ORAL_TABLET | Freq: Every day | ORAL | 0 refills | Status: AC

## 2024-01-30 ENCOUNTER — Other Ambulatory Visit (HOSPITAL_BASED_OUTPATIENT_CLINIC_OR_DEPARTMENT_OTHER): Payer: Self-pay | Admitting: Cardiovascular Disease
